# Patient Record
Sex: Male | Born: 1975 | Race: White | Hispanic: No | Marital: Married | Smoking: Current some day smoker
Health system: Southern US, Community
[De-identification: ages and names within clinical notes are randomized; demographics above are authoritative.]

## PROBLEM LIST (undated history)

## (undated) DIAGNOSIS — I2510 Atherosclerotic heart disease of native coronary artery without angina pectoris: Secondary | ICD-10-CM

## (undated) DIAGNOSIS — R112 Nausea with vomiting, unspecified: Secondary | ICD-10-CM

## (undated) DIAGNOSIS — I10 Essential (primary) hypertension: Secondary | ICD-10-CM

## (undated) DIAGNOSIS — M1990 Unspecified osteoarthritis, unspecified site: Secondary | ICD-10-CM

## (undated) DIAGNOSIS — Z8489 Family history of other specified conditions: Secondary | ICD-10-CM

## (undated) DIAGNOSIS — F329 Major depressive disorder, single episode, unspecified: Secondary | ICD-10-CM

## (undated) DIAGNOSIS — I209 Angina pectoris, unspecified: Secondary | ICD-10-CM

## (undated) DIAGNOSIS — K429 Umbilical hernia without obstruction or gangrene: Secondary | ICD-10-CM

## (undated) DIAGNOSIS — Z9889 Other specified postprocedural states: Secondary | ICD-10-CM

## (undated) DIAGNOSIS — K219 Gastro-esophageal reflux disease without esophagitis: Secondary | ICD-10-CM

## (undated) DIAGNOSIS — G629 Polyneuropathy, unspecified: Secondary | ICD-10-CM

## (undated) DIAGNOSIS — F32A Depression, unspecified: Secondary | ICD-10-CM

## (undated) HISTORY — PX: CORONARY ANGIOPLASTY: SHX604

## (undated) HISTORY — PX: TONSILLECTOMY: SUR1361

## (undated) HISTORY — PX: SHOULDER ARTHROSCOPY: SHX128

## (undated) HISTORY — PX: HERNIA REPAIR: SHX51

---

## 2006-02-27 ENCOUNTER — Emergency Department: Payer: Self-pay

## 2008-09-10 ENCOUNTER — Emergency Department: Payer: Self-pay | Admitting: Emergency Medicine

## 2009-03-27 ENCOUNTER — Emergency Department: Payer: Self-pay | Admitting: Emergency Medicine

## 2011-11-18 ENCOUNTER — Emergency Department: Payer: Self-pay | Admitting: Emergency Medicine

## 2012-11-24 HISTORY — PX: CORONARY STENT PLACEMENT: SHX1402

## 2012-11-25 ENCOUNTER — Inpatient Hospital Stay: Payer: Self-pay | Admitting: Internal Medicine

## 2012-11-25 LAB — CK TOTAL AND CKMB (NOT AT ARMC)
CK-MB: 0.6 ng/mL (ref 0.5–3.6)
CK-MB: 0.8 ng/mL (ref 0.5–3.6)

## 2012-11-25 LAB — COMPREHENSIVE METABOLIC PANEL
Albumin: 4.1 g/dL (ref 3.4–5.0)
Anion Gap: 6 — ABNORMAL LOW (ref 7–16)
BUN: 13 mg/dL (ref 7–18)
Chloride: 110 mmol/L — ABNORMAL HIGH (ref 98–107)
EGFR (African American): >60
EGFR (Non-African Amer.): >60
Osmolality: 278 (ref 275–301)
Potassium: 3.9 mmol/L (ref 3.5–5.1)
SGOT(AST): 22 U/L (ref 15–37)

## 2012-11-25 LAB — CBC
HCT: 43.6 % (ref 40.0–52.0)
HGB: 15.3 g/dL (ref 13.0–18.0)
MCH: 31.1 pg (ref 26.0–34.0)
RBC: 4.91 10*6/uL (ref 4.40–5.90)
RDW: 13.4 % (ref 11.5–14.5)
WBC: 11.1 10*3/uL — ABNORMAL HIGH (ref 3.8–10.6)

## 2012-11-25 LAB — TROPONIN I: Troponin-I: <0.02 ng/mL

## 2012-11-26 LAB — BASIC METABOLIC PANEL
Chloride: 114 mmol/L — ABNORMAL HIGH (ref 98–107)
EGFR (African American): >60
EGFR (Non-African Amer.): >60
Glucose: 112 mg/dL — ABNORMAL HIGH (ref 65–99)
Osmolality: 284 (ref 275–301)
Potassium: 3.6 mmol/L (ref 3.5–5.1)
Sodium: 142 mmol/L (ref 136–145)

## 2012-11-26 LAB — CBC WITH DIFFERENTIAL/PLATELET
Eosinophil #: 0.5 10*3/uL (ref 0.0–0.7)
Eosinophil %: 4.1 %
HCT: 39.5 % — ABNORMAL LOW (ref 40.0–52.0)
Lymphocyte %: 41.9 %
MCH: 31.4 pg (ref 26.0–34.0)
MCHC: 35.2 g/dL (ref 32.0–36.0)
MCV: 89 fL (ref 80–100)
Monocyte %: 7.9 %
Platelet: 235 10*3/uL (ref 150–440)
RBC: 4.44 10*6/uL (ref 4.40–5.90)
RDW: 13.5 % (ref 11.5–14.5)
WBC: 13.1 10*3/uL — ABNORMAL HIGH (ref 3.8–10.6)

## 2012-11-26 LAB — LIPID PANEL
Cholesterol: 131 mg/dL (ref 0–200)
HDL Cholesterol: 22 mg/dL — ABNORMAL LOW (ref 40–60)
Triglycerides: 193 mg/dL (ref 0–200)
VLDL Cholesterol, Calc: 39 mg/dL (ref 5–40)

## 2012-11-26 LAB — TROPONIN I: Troponin-I: <0.02 ng/mL

## 2012-11-26 LAB — CK TOTAL AND CKMB (NOT AT ARMC): CK-MB: 0.5 ng/mL (ref 0.5–3.6)

## 2012-11-27 LAB — BASIC METABOLIC PANEL
Anion Gap: 7 (ref 7–16)
BUN: 10 mg/dL (ref 7–18)
Calcium, Total: 8.6 mg/dL (ref 8.5–10.1)
Co2: 24 mmol/L (ref 21–32)
Creatinine: 0.88 mg/dL (ref 0.60–1.30)
EGFR (Non-African Amer.): >60
Osmolality: 282 (ref 275–301)
Potassium: 3.6 mmol/L (ref 3.5–5.1)
Sodium: 142 mmol/L (ref 136–145)

## 2012-11-27 LAB — CK TOTAL AND CKMB (NOT AT ARMC)
CK, Total: 149 U/L (ref 35–232)
CK-MB: 5 ng/mL — ABNORMAL HIGH (ref 0.5–3.6)

## 2012-12-14 ENCOUNTER — Encounter: Payer: Self-pay | Admitting: Cardiovascular Disease

## 2012-12-25 ENCOUNTER — Encounter: Payer: Self-pay | Admitting: Cardiovascular Disease

## 2012-12-26 ENCOUNTER — Emergency Department: Payer: Self-pay | Admitting: Emergency Medicine

## 2013-01-21 ENCOUNTER — Encounter: Payer: Self-pay | Admitting: Nurse Practitioner

## 2013-01-24 ENCOUNTER — Encounter: Payer: Self-pay | Admitting: Nurse Practitioner

## 2013-01-24 ENCOUNTER — Encounter: Payer: Self-pay | Admitting: Cardiovascular Disease

## 2013-02-07 ENCOUNTER — Ambulatory Visit: Payer: Self-pay | Admitting: Orthopedic Surgery

## 2013-02-24 ENCOUNTER — Encounter: Payer: Self-pay | Admitting: Nurse Practitioner

## 2013-07-22 ENCOUNTER — Other Ambulatory Visit: Payer: Self-pay | Admitting: Neurosurgery

## 2013-08-02 DIAGNOSIS — I209 Angina pectoris, unspecified: Secondary | ICD-10-CM

## 2013-08-02 HISTORY — DX: Angina pectoris, unspecified: I20.9

## 2013-08-02 LAB — BASIC METABOLIC PANEL
Anion Gap: 7 (ref 7–16)
BUN: 9 mg/dL (ref 7–18)
CALCIUM: 8.9 mg/dL (ref 8.5–10.1)
CHLORIDE: 109 mmol/L — AB (ref 98–107)
CO2: 24 mmol/L (ref 21–32)
Creatinine: 0.86 mg/dL (ref 0.60–1.30)
GLUCOSE: 96 mg/dL (ref 65–99)
OSMOLALITY: 278 (ref 275–301)
Potassium: 3.9 mmol/L (ref 3.5–5.1)
SODIUM: 140 mmol/L (ref 136–145)

## 2013-08-02 LAB — TROPONIN I: Troponin-I: <0.02 ng/mL

## 2013-08-02 LAB — CBC
HCT: 45.9 % (ref 40.0–52.0)
HGB: 15.3 g/dL (ref 13.0–18.0)
MCH: 29.9 pg (ref 26.0–34.0)
MCHC: 33.2 g/dL (ref 32.0–36.0)
MCV: 90 fL (ref 80–100)
PLATELETS: 282 10*3/uL (ref 150–440)
RBC: 5.1 10*6/uL (ref 4.40–5.90)
RDW: 13.3 % (ref 11.5–14.5)
WBC: 15.7 10*3/uL — AB (ref 3.8–10.6)

## 2013-08-03 ENCOUNTER — Inpatient Hospital Stay (HOSPITAL_COMMUNITY): Admission: RE | Admit: 2013-08-03 | Payer: Self-pay | Source: Ambulatory Visit

## 2013-08-03 ENCOUNTER — Observation Stay: Payer: Self-pay | Admitting: Specialist

## 2013-08-03 ENCOUNTER — Encounter (HOSPITAL_COMMUNITY): Payer: Self-pay | Admitting: Pharmacy Technician

## 2013-08-03 LAB — CK-MB
CK-MB: 0.5 ng/mL (ref 0.5–3.6)
CK-MB: 0.6 ng/mL (ref 0.5–3.6)
CK-MB: 0.6 ng/mL (ref 0.5–3.6)

## 2013-08-03 LAB — TROPONIN I: Troponin-I: <0.02 ng/mL

## 2013-08-03 LAB — LIPID PANEL
CHOLESTEROL: 113 mg/dL (ref 0–200)
HDL Cholesterol: 23 mg/dL — ABNORMAL LOW (ref 40–60)
Ldl Cholesterol, Calc: 58 mg/dL (ref 0–100)
TRIGLYCERIDES: 162 mg/dL (ref 0–200)
VLDL Cholesterol, Calc: 32 mg/dL (ref 5–40)

## 2013-08-08 ENCOUNTER — Encounter (HOSPITAL_COMMUNITY)
Admission: RE | Admit: 2013-08-08 | Discharge: 2013-08-08 | Disposition: A | Discharge status: Home / Self Care | Payer: No Typology Code available for payment source | Source: Ambulatory Visit | Attending: Neurosurgery | Admitting: Neurosurgery

## 2013-08-08 ENCOUNTER — Encounter (HOSPITAL_COMMUNITY): Payer: Self-pay

## 2013-08-08 HISTORY — DX: Gastro-esophageal reflux disease without esophagitis: K21.9

## 2013-08-08 HISTORY — DX: Umbilical hernia without obstruction or gangrene: K42.9

## 2013-08-08 HISTORY — DX: Major depressive disorder, single episode, unspecified: F32.9

## 2013-08-08 HISTORY — DX: Family history of other specified conditions: Z84.89

## 2013-08-08 HISTORY — DX: Polyneuropathy, unspecified: G62.9

## 2013-08-08 HISTORY — DX: Nausea with vomiting, unspecified: R11.2

## 2013-08-08 HISTORY — DX: Depression, unspecified: F32.A

## 2013-08-08 HISTORY — DX: Other specified postprocedural states: Z98.890

## 2013-08-08 HISTORY — DX: Atherosclerotic heart disease of native coronary artery without angina pectoris: I25.10

## 2013-08-08 HISTORY — DX: Angina pectoris, unspecified: I20.9

## 2013-08-08 HISTORY — DX: Unspecified osteoarthritis, unspecified site: M19.90

## 2013-08-08 HISTORY — DX: Essential (primary) hypertension: I10

## 2013-08-08 LAB — BASIC METABOLIC PANEL
BUN: 14 mg/dL (ref 6–23)
CALCIUM: 9.3 mg/dL (ref 8.4–10.5)
CO2: 24 meq/L (ref 19–32)
Chloride: 103 mEq/L (ref 96–112)
Creatinine, Ser: 0.83 mg/dL (ref 0.50–1.35)
GFR calc Af Amer: >90 mL/min (ref >90)
GFR calc non Af Amer: >90 mL/min (ref >90)
GLUCOSE: 109 mg/dL — AB (ref 70–99)
Potassium: 3.9 mEq/L (ref 3.7–5.3)
Sodium: 139 mEq/L (ref 137–147)

## 2013-08-08 LAB — CBC
HEMATOCRIT: 42.8 % (ref 39.0–52.0)
HEMOGLOBIN: 15 g/dL (ref 13.0–17.0)
MCH: 30.7 pg (ref 26.0–34.0)
MCHC: 35 g/dL (ref 30.0–36.0)
MCV: 87.7 fL (ref 78.0–100.0)
Platelets: 286 10*3/uL (ref 150–400)
RBC: 4.88 MIL/uL (ref 4.22–5.81)
RDW: 12.8 % (ref 11.5–15.5)
WBC: 13.1 10*3/uL — AB (ref 4.0–10.5)

## 2013-08-08 LAB — SURGICAL PCR SCREEN
MRSA, PCR: NEGATIVE
Staphylococcus aureus: NEGATIVE

## 2013-08-08 MED ORDER — CEFAZOLIN SODIUM-DEXTROSE 2-3 GM-% IV SOLR
2.0000 g | INTRAVENOUS | Status: AC
  Administered 2013-08-09: 2 g via INTRAVENOUS
  Filled 2013-08-08: qty 50

## 2013-08-08 MED ORDER — DEXAMETHASONE SODIUM PHOSPHATE 10 MG/ML IJ SOLN
10.0000 mg | INTRAMUSCULAR | Status: AC
  Administered 2013-08-09: 10 mg via INTRAVENOUS
  Filled 2013-08-08: qty 1

## 2013-08-08 NOTE — Progress Notes (Addendum)
Mr Gabriel Ward had a cardiac stent 11/2012, Cardiologist is Dr Laurelyn Sickle in [place].  Mr Gabriel Ward saw Dr Humphrey Rolls for clearance for surgery 06/23/13, patient was instructed at that time to stop Plavix 6 days prior to surgery.  Patient reports last dose was 08/03/13.     Mr Gabriel Ward was seen in [place] ED , 6/9 with chest pain.  EKG, troponins were normal.  Chest pain was thought to be due to GERD.  Patient had a stress test 6/10 at Dr Laurelyn Sickle office.  I requested all information from ED and Dr Laurelyn Sickle office.  O spoke with Dr Albertina Parr and read to him Dr Laurelyn Sickle note from ED notes. I do not have stress test or Dr Laurelyn Sickle notes from it as of yet.  No new orders from Dr Albertina Parr, it should be ok, we will  look at everything in am.    Mr Gabriel Ward denies any further chest pain.

## 2013-08-08 NOTE — Pre-Procedure Instructions (Signed)
Elmer  08/08/2013   Your procedure is scheduled on:  Tuesday, June 16.  Report to [facility] Admitting at 5:30 AM.  Call this number if you have problems the morning of surgery: [phone]   Remember:   Do not eat food or drink liquids after midnight.   Take these medicines the morning of surgery with A SIP OF WATER: metoprolol succinate (TOPROL-XL), sertraline (ZOLOFT).              Take if needed:cyclobenzaprine (FLEXERIL), HYDROcodone-acetaminophen (NORCO/VICODIN).    Do not wear jewelry, make-up or nail polish.  Do not wear lotions, powders, or perfumes.    Men may shave face and neck.  Do not bring valuables to the hospital.             [facility] is not responsible for any belongings or valuables.               Contacts, dentures or bridgework may not be worn into surgery.  Leave suitcase in the car. After surgery it may be brought to your room.  For patients admitted to the hospital, discharge time is determined by your  treatment team.               Patients discharged the day of surgery will not be allowed to drive home.  Name and phone number of your driver: -  Special Instructions: Review  [facility] - Preparing For Surgery.   Please read over the following fact sheets that you were given: Pain Booklet, Coughing and Deep Breathing and Surgical Site Infection Prevention

## 2013-08-09 ENCOUNTER — Ambulatory Visit (HOSPITAL_COMMUNITY)
Admission: RE | Admit: 2013-08-09 | Discharge: 2013-08-10 | Disposition: A | Discharge status: Home / Self Care | Payer: No Typology Code available for payment source | Source: Ambulatory Visit | Attending: Neurosurgery | Admitting: Neurosurgery

## 2013-08-09 ENCOUNTER — Encounter (HOSPITAL_COMMUNITY): Payer: No Typology Code available for payment source | Admitting: Anesthesiology

## 2013-08-09 ENCOUNTER — Ambulatory Visit (HOSPITAL_COMMUNITY): Payer: No Typology Code available for payment source | Admitting: Anesthesiology

## 2013-08-09 ENCOUNTER — Ambulatory Visit (HOSPITAL_COMMUNITY): Payer: No Typology Code available for payment source

## 2013-08-09 ENCOUNTER — Encounter (HOSPITAL_COMMUNITY)
Admission: RE | Disposition: A | Discharge status: Home / Self Care | Payer: Self-pay | Source: Ambulatory Visit | Attending: Neurosurgery

## 2013-08-09 ENCOUNTER — Encounter (HOSPITAL_COMMUNITY): Payer: Self-pay

## 2013-08-09 DIAGNOSIS — M5126 Other intervertebral disc displacement, lumbar region: Secondary | ICD-10-CM | POA: Insufficient documentation

## 2013-08-09 DIAGNOSIS — Z7982 Long term (current) use of aspirin: Secondary | ICD-10-CM | POA: Insufficient documentation

## 2013-08-09 DIAGNOSIS — M129 Arthropathy, unspecified: Secondary | ICD-10-CM | POA: Insufficient documentation

## 2013-08-09 DIAGNOSIS — K219 Gastro-esophageal reflux disease without esophagitis: Secondary | ICD-10-CM | POA: Insufficient documentation

## 2013-08-09 DIAGNOSIS — F329 Major depressive disorder, single episode, unspecified: Secondary | ICD-10-CM | POA: Insufficient documentation

## 2013-08-09 DIAGNOSIS — I2510 Atherosclerotic heart disease of native coronary artery without angina pectoris: Secondary | ICD-10-CM | POA: Insufficient documentation

## 2013-08-09 DIAGNOSIS — G589 Mononeuropathy, unspecified: Secondary | ICD-10-CM | POA: Insufficient documentation

## 2013-08-09 DIAGNOSIS — I209 Angina pectoris, unspecified: Secondary | ICD-10-CM | POA: Insufficient documentation

## 2013-08-09 DIAGNOSIS — K429 Umbilical hernia without obstruction or gangrene: Secondary | ICD-10-CM | POA: Insufficient documentation

## 2013-08-09 DIAGNOSIS — Z01812 Encounter for preprocedural laboratory examination: Secondary | ICD-10-CM | POA: Insufficient documentation

## 2013-08-09 DIAGNOSIS — Z7902 Long term (current) use of antithrombotics/antiplatelets: Secondary | ICD-10-CM | POA: Insufficient documentation

## 2013-08-09 DIAGNOSIS — F17200 Nicotine dependence, unspecified, uncomplicated: Secondary | ICD-10-CM | POA: Insufficient documentation

## 2013-08-09 DIAGNOSIS — I10 Essential (primary) hypertension: Secondary | ICD-10-CM | POA: Insufficient documentation

## 2013-08-09 DIAGNOSIS — F3289 Other specified depressive episodes: Secondary | ICD-10-CM | POA: Insufficient documentation

## 2013-08-09 HISTORY — PX: LUMBAR LAMINECTOMY/DECOMPRESSION MICRODISCECTOMY: SHX5026

## 2013-08-09 SURGERY — LUMBAR LAMINECTOMY/DECOMPRESSION MICRODISCECTOMY 1 LEVEL
Anesthesia: General | Site: Back | Laterality: Bilateral

## 2013-08-09 MED ORDER — HEMOSTATIC AGENTS (NO CHARGE) OPTIME
TOPICAL | Status: DC | PRN
  Administered 2013-08-09: 1 via TOPICAL

## 2013-08-09 MED ORDER — PHENOL 1.4 % MT LIQD: 1.0000 | OROMUCOSAL | Status: DC | PRN

## 2013-08-09 MED ORDER — ONDANSETRON HCL 4 MG/2ML IJ SOLN
4.0000 mg | INTRAMUSCULAR | Status: DC | PRN
  Administered 2013-08-09: 4 mg via INTRAVENOUS
  Filled 2013-08-09: qty 2

## 2013-08-09 MED ORDER — BUPIVACAINE HCL (PF) 0.5 % IJ SOLN
INTRAMUSCULAR | Status: DC | PRN
  Administered 2013-08-09: 20 mL

## 2013-08-09 MED ORDER — SODIUM CHLORIDE 0.9 % IR SOLN
Status: DC | PRN
  Administered 2013-08-09: 09:00:00

## 2013-08-09 MED ORDER — SUFENTANIL CITRATE 50 MCG/ML IV SOLN
INTRAVENOUS | Status: DC | PRN
  Administered 2013-08-09 (×2): 10 ug via INTRAVENOUS

## 2013-08-09 MED ORDER — SUFENTANIL CITRATE 50 MCG/ML IV SOLN
INTRAVENOUS | Status: AC
  Filled 2013-08-09: qty 1

## 2013-08-09 MED ORDER — PANTOPRAZOLE SODIUM 40 MG IV SOLR
40.0000 mg | Freq: Every day | INTRAVENOUS | Status: DC
  Filled 2013-08-09: qty 40

## 2013-08-09 MED ORDER — ACETAMINOPHEN 650 MG RE SUPP
650.0000 mg | RECTAL | Status: DC | PRN
Start: 2013-08-09 — End: 2013-08-10

## 2013-08-09 MED ORDER — METOPROLOL SUCCINATE ER 25 MG PO TB24
25.0000 mg | ORAL_TABLET | Freq: Every day | ORAL | Status: DC
  Filled 2013-08-09 (×2): qty 1

## 2013-08-09 MED ORDER — 0.9 % SODIUM CHLORIDE (POUR BTL) OPTIME
TOPICAL | Status: DC | PRN
  Administered 2013-08-09: 1000 mL

## 2013-08-09 MED ORDER — PANTOPRAZOLE SODIUM 40 MG PO TBEC
40.0000 mg | DELAYED_RELEASE_TABLET | Freq: Every day | ORAL | Status: DC
  Administered 2013-08-09: 40 mg via ORAL
  Filled 2013-08-09: qty 1

## 2013-08-09 MED ORDER — PROPOFOL 10 MG/ML IV BOLUS
INTRAVENOUS | Status: AC
  Filled 2013-08-09: qty 20

## 2013-08-09 MED ORDER — NEOSTIGMINE METHYLSULFATE 10 MG/10ML IV SOLN
INTRAVENOUS | Status: DC | PRN
  Administered 2013-08-09: 4 mg via INTRAVENOUS

## 2013-08-09 MED ORDER — CYCLOBENZAPRINE HCL 10 MG PO TABS
10.0000 mg | ORAL_TABLET | Freq: Three times a day (TID) | ORAL | Status: DC | PRN
  Administered 2013-08-09: 10 mg via ORAL
  Filled 2013-08-09: qty 1

## 2013-08-09 MED ORDER — SODIUM CHLORIDE 0.9 % IV SOLN: 250.0000 mL | INTRAVENOUS | Status: DC

## 2013-08-09 MED ORDER — ACETAMINOPHEN 325 MG PO TABS: 650.0000 mg | ORAL_TABLET | ORAL | Status: DC | PRN

## 2013-08-09 MED ORDER — HYDROCODONE-ACETAMINOPHEN 5-325 MG PO TABS
1.0000 | ORAL_TABLET | ORAL | Status: DC | PRN
  Administered 2013-08-09 – 2013-08-10 (×4): 2 via ORAL
  Filled 2013-08-09 (×4): qty 2

## 2013-08-09 MED ORDER — ROCURONIUM BROMIDE 100 MG/10ML IV SOLN
INTRAVENOUS | Status: DC | PRN
  Administered 2013-08-09: 50 mg via INTRAVENOUS
  Administered 2013-08-09: 10 mg via INTRAVENOUS

## 2013-08-09 MED ORDER — KCL IN DEXTROSE-NACL 20-5-0.45 MEQ/L-%-% IV SOLN
80.0000 mL/h | INTRAVENOUS | Status: DC
  Administered 2013-08-09: 80 mL/h via INTRAVENOUS
  Filled 2013-08-09 (×3): qty 1000

## 2013-08-09 MED ORDER — ATORVASTATIN CALCIUM 80 MG PO TABS
80.0000 mg | ORAL_TABLET | Freq: Every day | ORAL | Status: DC
  Administered 2013-08-09: 80 mg via ORAL
  Filled 2013-08-09 (×2): qty 1

## 2013-08-09 MED ORDER — SODIUM CHLORIDE 0.9 % IJ SOLN: 3.0000 mL | INTRAMUSCULAR | Status: DC | PRN

## 2013-08-09 MED ORDER — PROPOFOL 10 MG/ML IV BOLUS
INTRAVENOUS | Status: DC | PRN
  Administered 2013-08-09: 180 mg via INTRAVENOUS

## 2013-08-09 MED ORDER — ONDANSETRON HCL 4 MG/2ML IJ SOLN
INTRAMUSCULAR | Status: DC | PRN
  Administered 2013-08-09: 4 mg via INTRAVENOUS

## 2013-08-09 MED ORDER — ASPIRIN EC 81 MG PO TBEC
81.0000 mg | DELAYED_RELEASE_TABLET | Freq: Every day | ORAL | Status: DC
  Administered 2013-08-09: 81 mg via ORAL
  Filled 2013-08-09 (×2): qty 1

## 2013-08-09 MED ORDER — DOCUSATE SODIUM 100 MG PO CAPS
100.0000 mg | ORAL_CAPSULE | Freq: Two times a day (BID) | ORAL | Status: DC
  Administered 2013-08-09 (×2): 100 mg via ORAL
  Filled 2013-08-09 (×4): qty 1

## 2013-08-09 MED ORDER — SODIUM CHLORIDE 0.9 % IJ SOLN
3.0000 mL | Freq: Two times a day (BID) | INTRAMUSCULAR | Status: DC
  Administered 2013-08-09: 3 mL via INTRAVENOUS

## 2013-08-09 MED ORDER — KETOROLAC TROMETHAMINE 30 MG/ML IJ SOLN
INTRAMUSCULAR | Status: DC | PRN
  Administered 2013-08-09: 30 mg via INTRAVENOUS

## 2013-08-09 MED ORDER — THROMBIN 5000 UNITS EX SOLR
CUTANEOUS | Status: DC | PRN
  Administered 2013-08-09 (×2): 5000 [IU] via TOPICAL

## 2013-08-09 MED ORDER — GLYCOPYRROLATE 0.2 MG/ML IJ SOLN
INTRAMUSCULAR | Status: DC | PRN
  Administered 2013-08-09: .7 mg via INTRAVENOUS

## 2013-08-09 MED ORDER — CEFAZOLIN SODIUM-DEXTROSE 2-3 GM-% IV SOLR
2.0000 g | Freq: Three times a day (TID) | INTRAVENOUS | Status: AC
  Administered 2013-08-09 (×2): 2 g via INTRAVENOUS
  Filled 2013-08-09 (×2): qty 50

## 2013-08-09 MED ORDER — FENTANYL CITRATE 0.05 MG/ML IJ SOLN
25.0000 ug | INTRAMUSCULAR | Status: DC | PRN
  Administered 2013-08-09 (×2): 50 ug via INTRAVENOUS

## 2013-08-09 MED ORDER — FENTANYL CITRATE 0.05 MG/ML IJ SOLN
INTRAMUSCULAR | Status: AC
  Filled 2013-08-09: qty 2

## 2013-08-09 MED ORDER — HYDROMORPHONE HCL PF 1 MG/ML IJ SOLN: 1.0000 mg | INTRAMUSCULAR | Status: DC | PRN

## 2013-08-09 MED ORDER — MIDAZOLAM HCL 2 MG/2ML IJ SOLN
INTRAMUSCULAR | Status: AC
  Filled 2013-08-09: qty 2

## 2013-08-09 MED ORDER — KETOROLAC TROMETHAMINE 30 MG/ML IJ SOLN
30.0000 mg | Freq: Four times a day (QID) | INTRAMUSCULAR | Status: DC
  Administered 2013-08-09 – 2013-08-10 (×4): 30 mg via INTRAVENOUS
  Filled 2013-08-09 (×8): qty 1

## 2013-08-09 MED ORDER — ONDANSETRON HCL 4 MG/2ML IJ SOLN
INTRAMUSCULAR | Status: AC
  Filled 2013-08-09: qty 2

## 2013-08-09 MED ORDER — MENTHOL 3 MG MT LOZG
1.0000 | LOZENGE | OROMUCOSAL | Status: DC | PRN
Start: 2013-08-09 — End: 2013-08-10

## 2013-08-09 MED ORDER — MIDAZOLAM HCL 5 MG/5ML IJ SOLN
INTRAMUSCULAR | Status: DC | PRN
  Administered 2013-08-09: 2 mg via INTRAVENOUS

## 2013-08-09 MED ORDER — GABAPENTIN 300 MG PO CAPS
600.0000 mg | ORAL_CAPSULE | Freq: Every day | ORAL | Status: DC
  Administered 2013-08-09: 600 mg via ORAL
  Filled 2013-08-09 (×2): qty 2

## 2013-08-09 MED ORDER — LACTATED RINGERS IV SOLN
INTRAVENOUS | Status: DC | PRN
  Administered 2013-08-09 (×2): via INTRAVENOUS

## 2013-08-09 MED ORDER — PHENYLEPHRINE HCL 10 MG/ML IJ SOLN
INTRAMUSCULAR | Status: DC | PRN
Start: 2013-08-09 — End: 2013-08-09
  Administered 2013-08-09: 40 ug via INTRAVENOUS

## 2013-08-09 MED ORDER — LIDOCAINE HCL (CARDIAC) 20 MG/ML IV SOLN
INTRAVENOUS | Status: DC | PRN
  Administered 2013-08-09: 50 mg via INTRAVENOUS

## 2013-08-09 MED ORDER — SERTRALINE HCL 100 MG PO TABS
100.0000 mg | ORAL_TABLET | Freq: Every day | ORAL | Status: DC
  Filled 2013-08-09 (×2): qty 1

## 2013-08-09 SURGICAL SUPPLY — 47 items
BAG DECANTER FOR FLEXI CONT (MISCELLANEOUS) ×3 IMPLANT
BENZOIN TINCTURE PRP APPL 2/3 (GAUZE/BANDAGES/DRESSINGS) ×3 IMPLANT
BLADE 10 SAFETY STRL DISP (BLADE) ×3 IMPLANT
BLADE SURG ROTATE 9660 (MISCELLANEOUS) ×3 IMPLANT
BRUSH SCRUB EZ PLAIN DRY (MISCELLANEOUS) ×3 IMPLANT
BUR CUTTER 7.0 ROUND (BURR) ×3 IMPLANT
BUR MATCHSTICK NEURO 3.0 LAGG (BURR) ×3 IMPLANT
CANISTER SUCT 3000ML (MISCELLANEOUS) ×3 IMPLANT
CLOSURE WOUND 1/2 X4 (GAUZE/BANDAGES/DRESSINGS) ×2
CONT SPEC 4OZ CLIKSEAL STRL BL (MISCELLANEOUS) ×3 IMPLANT
DERMABOND ADVANCED (GAUZE/BANDAGES/DRESSINGS) ×2
DERMABOND ADVANCED .7 DNX12 (GAUZE/BANDAGES/DRESSINGS) ×1 IMPLANT
DRAPE LAPAROTOMY 100X72X124 (DRAPES) ×3 IMPLANT
DRAPE MICROSCOPE ZEISS OPMI (DRAPES) ×3 IMPLANT
DRAPE SURG 17X23 STRL (DRAPES) ×6 IMPLANT
DRSG OPSITE POSTOP 4X6 (GAUZE/BANDAGES/DRESSINGS) ×3 IMPLANT
DRSG TELFA 3X8 NADH (GAUZE/BANDAGES/DRESSINGS) ×3 IMPLANT
DURAPREP 26ML APPLICATOR (WOUND CARE) ×3 IMPLANT
ELECT BLADE 4.0 EZ CLEAN MEGAD (MISCELLANEOUS) ×3
ELECT REM PT RETURN 9FT ADLT (ELECTROSURGICAL) ×3
ELECTRODE BLDE 4.0 EZ CLN MEGD (MISCELLANEOUS) ×1 IMPLANT
ELECTRODE REM PT RTRN 9FT ADLT (ELECTROSURGICAL) ×1 IMPLANT
GAUZE SPONGE 4X4 16PLY XRAY LF (GAUZE/BANDAGES/DRESSINGS) IMPLANT
GLOVE ECLIPSE 8.0 STRL XLNG CF (GLOVE) ×3 IMPLANT
GOWN STRL REUS W/ TWL LRG LVL3 (GOWN DISPOSABLE) IMPLANT
GOWN STRL REUS W/ TWL XL LVL3 (GOWN DISPOSABLE) ×1 IMPLANT
GOWN STRL REUS W/TWL 2XL LVL3 (GOWN DISPOSABLE) IMPLANT
GOWN STRL REUS W/TWL LRG LVL3 (GOWN DISPOSABLE)
GOWN STRL REUS W/TWL XL LVL3 (GOWN DISPOSABLE) ×2
KIT BASIN OR (CUSTOM PROCEDURE TRAY) ×3 IMPLANT
KIT ROOM TURNOVER OR (KITS) ×3 IMPLANT
NEEDLE HYPO 22GX1.5 SAFETY (NEEDLE) ×3 IMPLANT
NEEDLE SPNL 22GX3.5 QUINCKE BK (NEEDLE) ×6 IMPLANT
NS IRRIG 1000ML POUR BTL (IV SOLUTION) ×3 IMPLANT
PACK LAMINECTOMY NEURO (CUSTOM PROCEDURE TRAY) ×3 IMPLANT
PAD ARMBOARD 7.5X6 YLW CONV (MISCELLANEOUS) ×9 IMPLANT
PATTIES SURGICAL .75X.75 (GAUZE/BANDAGES/DRESSINGS) ×3 IMPLANT
RUBBERBAND STERILE (MISCELLANEOUS) ×6 IMPLANT
SPONGE GAUZE 4X4 12PLY (GAUZE/BANDAGES/DRESSINGS) ×3 IMPLANT
SPONGE SURGIFOAM ABS GEL SZ50 (HEMOSTASIS) ×3 IMPLANT
STRIP CLOSURE SKIN 1/2X4 (GAUZE/BANDAGES/DRESSINGS) ×4 IMPLANT
SUT VIC AB 2-0 OS6 18 (SUTURE) ×9 IMPLANT
SUT VIC AB 3-0 CP2 18 (SUTURE) ×3 IMPLANT
SYR 20ML ECCENTRIC (SYRINGE) ×3 IMPLANT
TOWEL OR 17X24 6PK STRL BLUE (TOWEL DISPOSABLE) ×3 IMPLANT
TOWEL OR 17X26 10 PK STRL BLUE (TOWEL DISPOSABLE) ×3 IMPLANT
WATER STERILE IRR 1000ML POUR (IV SOLUTION) ×3 IMPLANT

## 2013-08-09 NOTE — Anesthesia Postprocedure Evaluation (Signed)
  Anesthesia Post-op Note  Patient: Gabriel Ward  Procedure(s) Performed: Procedure(s) with comments: BILATERAL LUMBAR LAMINECTOMY/DECOMPRESSION MICRODISCECTOMY LUMBAR FIVE-FOUR  (Bilateral) - BILATERAL LUMBAR LAMINECTOMY/DECOMPRESSION MICRODISCECTOMY LUMBAR FIVE-FOUR   Patient Location: PACU  Anesthesia Type:General  Level of Consciousness: awake  Airway and Oxygen Therapy: Patient Spontanous Breathing  Post-op Pain: mild  Post-op Assessment: Post-op Vital signs reviewed  Post-op Vital Signs: Reviewed  Last Vitals:  Filed Vitals:   08/09/13 1000  BP: 103/72  Pulse: 78  Temp:   Resp: 15    Complications: No apparent anesthesia complications

## 2013-08-09 NOTE — Op Note (Signed)
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 Localizing x-rays taken prior to incision to identify the appropriate level. Midline incision was made above the spinous processes of L4 and L5. Using Bovie cutting current the incision was carried down to the spinous processes. Subperiosteal dissection was then carried out bilaterally on the spinous processes lamina facet joint at L4-5. Self-retaining tract was placed for exposure and x-ray showed approach the appropriate level. Spinous process was mostly removed at L4-5. On the patient's left side generous laminotomy was performed removing the inferior one half of the L4 lamina the medial one third of the facet joint the superior one third of the L5 lamina. Residual bone and ligamentum flavum removed in a piecemeal fashion. Similar decompression was then carried on the opposite side. At this time the microscope was draped brought into the field and used for the remainder of the case. On the left side, microdissection technique was used to identify the lateral aspect of the thecal sac and L5 nerve root. Further coagulation was carried out down before the canal to identify the L4-5 disc. It was coagulated and incised a 15 blade. Thorough displaced cleanout was carried operatively towards the midline a large fragments of disc were noted to be present. Thorough displaced cleanout was carried out with the same time great care was taken to avoid injury to the neural elements this was successfully done. At this point inspection was carried out in all directions for any evidence of residual compression and none could be identified. Irrigation was carried out and any bleeding control proper  coagulation Gelfoam. The was then closed in multiple layers of Vicryl on the muscle fascia subcutaneous and subcuticular tissues. Dermabond and Steri-Strips were placed on the skin. A sterile dressing was applied and the patient was extubated and taken to recovery room in stable condition.

## 2013-08-09 NOTE — Anesthesia Preprocedure Evaluation (Signed)
Anesthesia Evaluation  Patient identified by MRN, date of birth, ID band Patient awake    Reviewed: Allergy & Precautions, H&P , NPO status , Patient's Chart, lab work & pertinent test results  History of Anesthesia Complications (+) PONV  Airway Mallampati: II      Dental   Pulmonary Current Smoker,  breath sounds clear to auscultation        Cardiovascular hypertension, + angina + CAD Rhythm:Regular Rate:Normal     Neuro/Psych    GI/Hepatic GERD-  ,  Endo/Other    Renal/GU      Musculoskeletal   Abdominal   Peds  Hematology   Anesthesia Other Findings   Reproductive/Obstetrics                           Anesthesia Physical Anesthesia Plan  ASA: III  Anesthesia Plan: General   Post-op Pain Management:    Induction: Intravenous  Airway Management Planned: Oral ETT  Additional Equipment:   Intra-op Plan:   Post-operative Plan: Extubation in OR  Informed Consent: I have reviewed the patients History and Physical, chart, labs and discussed the procedure including the risks, benefits and alternatives for the proposed anesthesia with the patient or authorized representative who has indicated his/her understanding and acceptance.   Dental advisory given  Plan Discussed with: CRNA and Anesthesiologist  Anesthesia Plan Comments:         Anesthesia Quick Evaluation

## 2013-08-09 NOTE — H&P (Signed)
Gabriel Ward is an 38 y.o. male.   Chief Complaint: Back pain into the left leg HPI: The patient is a 38-year-old gentleman who was evaluated in the office for back pain with radiation into the left leg and numbness of both feet. He's had this problem for 8 months with no inciting event. Is trouble standing upright. He live with the problem for 1 month and then saw his medical doctor who sent to a pain specialist. Start physical therapy medication without improvement. He was evaluated in the office his MRI scan was reviewed which showed a very large central disc abnormality with significant stenosis at L4-5. After discussing the options and due to his lack of improvement with conservative therapy the patient requested surgery and now comes for a bilateral decompression L4-5 with a left-sided discectomy. I've had a long discussion with him regarding the risks and benefits of surgical intervention. The risks discussed include but are not limited to bleeding infection weakness numbness paralysis spinal fluid leakage coma and death. We have discussed alternative methods of therapy although risks and benefits of nonintervention. He's had the opportunity to ask numerous questions and appears to understand. With this information in hand he has requested we proceed with surgery.  Past Medical History  Diagnosis Date  . PONV (postoperative nausea and vomiting)   . Family history of anesthesia complication     Son- N/V  . Coronary artery disease   . Anginal pain 08/02/13    seen at [facility]  . Hypertension   . Depression   . GERD (gastroesophageal reflux disease)   . Hernia, umbilical   . Arthritis   . Neuropathy     Past Surgical History  Procedure Laterality Date  . Tonsillectomy    . Shoulder arthroscopy Left     bone spurs  . Hernia repair Right   . Coronary angioplasty    . Coronary stent placement  11/2012    History reviewed. No pertinent family history. Social History:  reports that  he has been smoking.  He does not have any smokeless tobacco history on file. He reports that he does not use illicit drugs. His alcohol history is not on file.  Allergies:  Allergies  Allergen Reactions  . Codeine     Medications Prior to Admission  Medication Sig Dispense Refill  . aspirin EC 81 MG tablet Take 81 mg by mouth daily.      . atorvastatin (LIPITOR) 80 MG tablet Take 80 mg by mouth daily.      . clopidogrel (PLAVIX) 75 MG tablet Take 75 mg by mouth daily with breakfast.      . gabapentin (NEURONTIN) 300 MG capsule Take 600 mg by mouth at bedtime.      . HYDROcodone-acetaminophen (NORCO/VICODIN) 5-325 MG per tablet Take 1 tablet by mouth every 6 (six) hours as needed for moderate pain.      . metoprolol succinate (TOPROL-XL) 25 MG 24 hr tablet Take 25 mg by mouth daily.      . pantoprazole (PROTONIX) 40 MG tablet Take 40 mg by mouth 2 (two) times daily.      . sertraline (ZOLOFT) 100 MG tablet Take 100 mg by mouth daily.      . cyclobenzaprine (FLEXERIL) 10 MG tablet Take 10 mg by mouth 3 (three) times daily as needed for muscle spasms.        Results for orders placed during the hospital encounter of 08/08/13 (from the past 48 hour(s))  SURGICAL PCR   SCREEN     Status: None   Collection Time    08/08/13  8:52 AM      Result Value Ref Range   MRSA, PCR NEGATIVE  NEGATIVE   Staphylococcus aureus NEGATIVE  NEGATIVE   Comment:            The Xpert SA Assay (FDA     approved for NASAL specimens     in patients over 71 years of age),     is one component of     a comprehensive surveillance     program.  Test performance has     been validated by [employer] for patients greater     than or equal to 30 year old.     It is not intended     to diagnose infection nor to     guide or monitor treatment.  BASIC METABOLIC PANEL     Status: Abnormal   Collection Time    08/08/13  8:52 AM      Result Value Ref Range   Sodium 139  137 - 147 mEq/L   Potassium 3.9  3.7 -  5.3 mEq/L   Chloride 103  96 - 112 mEq/L   CO2 24  19 - 32 mEq/L   Glucose, Bld 109 (*) 70 - 99 mg/dL   BUN 14  6 - 23 mg/dL   Creatinine, Ser 0.83  0.50 - 1.35 mg/dL   Calcium 9.3  8.4 - 10.5 mg/dL   GFR calc non Af Amer >90  >90 mL/min   GFR calc Af Amer >90  >90 mL/min   Comment: (NOTE)     The eGFR has been calculated using the CKD EPI equation.     This calculation has not been validated in all clinical situations.     eGFR's persistently <90 mL/min signify possible Chronic Kidney     Disease.  CBC     Status: Abnormal   Collection Time    08/08/13  8:52 AM      Result Value Ref Range   WBC 13.1 (*) 4.0 - 10.5 K/uL   RBC 4.88  4.22 - 5.81 MIL/uL   Hemoglobin 15.0  13.0 - 17.0 g/dL   HCT 42.8  39.0 - 52.0 %   MCV 87.7  78.0 - 100.0 fL   MCH 30.7  26.0 - 34.0 pg   MCHC 35.0  30.0 - 36.0 g/dL   RDW 12.8  11.5 - 15.5 %   Platelets 286  150 - 400 K/uL   No results found.  Positive for high blood pressure high cholesterol and depression as well as a history of heart disease  Blood pressure 106/65, pulse 70, temperature 98 F (36.7 C), temperature source Oral, resp. rate 20, height 5' 10" (1.778 m), weight 109.317 kg (241 lb), SpO2 100.00%.  The patient is awake or and oriented. He is no facial asymmetry. His gait is slow and mildly antalgic. He is slight decreased strength of dorsiflexion on the left. Reflexes are 1+ and equal. Assessment/Plan Impression is that of a large central disc herniation at L4-5 with significant stenosis. The plan is for a bilateral decompression with unilateral discectomy  Faythe Ghee, MD 08/09/2013, 7:34 AM

## 2013-08-09 NOTE — Transfer of Care (Signed)
``  Immediate Anesthesia Transfer of Care Note  Patient: Gabriel Ward  Procedure(s) Performed: Procedure(s) with comments: BILATERAL LUMBAR LAMINECTOMY/DECOMPRESSION MICRODISCECTOMY LUMBAR FIVE-FOUR  (Bilateral) - BILATERAL LUMBAR LAMINECTOMY/DECOMPRESSION MICRODISCECTOMY LUMBAR FIVE-FOUR   Patient Location: PACU  Anesthesia Type:General  Level of Consciousness: awake  Airway & Oxygen Therapy: Patient Spontanous Breathing and Patient connected to nasal cannula oxygen  Post-op Assessment: Report given to PACU RN and Post -op Vital signs reviewed and stable  Post vital signs: Reviewed and stable  Complications: No apparent anesthesia complications

## 2013-08-10 MED ORDER — HYDROCODONE-ACETAMINOPHEN 5-325 MG PO TABS: 1.0000 | ORAL_TABLET | ORAL | Status: AC | PRN

## 2013-08-10 NOTE — Discharge Summary (Signed)
  Physician Discharge Summary  Patient ID: Gabriel Ward MRN: [id number] DOB/AGE: 1975-10-04 38 y.o.  Admit date: 08/09/2013 Discharge date: 08/10/2013  Admission Diagnoses:  Discharge Diagnoses:  Active Problems:   Lumbar herniated disc   Discharged Condition: good  Hospital Course: Surgery one day for severe stenosis and large HNP. Did well. Marked relief of leg pain. Ambulated well. Wound healing fine. Home pod 1, specific instructions given.  Consults: None  Significant Diagnostic Studies: none  Treatments: surgery: L 45 decompression and discectomy  Discharge Exam: Blood pressure 101/66, pulse 68, temperature 97.7 F (36.5 C), temperature source Oral, resp. rate 20, height 5' 10"  (1.778 m), weight 109.317 kg (241 lb), SpO2 97.00%. Incision/Wound:clean and dry; no new neuro issues  Disposition: Final discharge disposition not confirmed     Medication List    ASK your doctor about these medications       aspirin EC 81 MG tablet  Take 81 mg by mouth daily.     atorvastatin 80 MG tablet  Commonly known as:  LIPITOR  Take 80 mg by mouth daily.     clopidogrel 75 MG tablet  Commonly known as:  PLAVIX  Take 75 mg by mouth daily with breakfast.     cyclobenzaprine 10 MG tablet  Commonly known as:  FLEXERIL  Take 10 mg by mouth 3 (three) times daily as needed for muscle spasms.     gabapentin 300 MG capsule  Commonly known as:  NEURONTIN  Take 600 mg by mouth at bedtime.     HYDROcodone-acetaminophen 5-325 MG per tablet  Commonly known as:  NORCO/VICODIN  Take 1 tablet by mouth every 6 (six) hours as needed for moderate pain.     metoprolol succinate 25 MG 24 hr tablet  Commonly known as:  TOPROL-XL  Take 25 mg by mouth daily.     pantoprazole 40 MG tablet  Commonly known as:  PROTONIX  Take 40 mg by mouth 2 (two) times daily.     sertraline 100 MG tablet  Commonly known as:  ZOLOFT  Take 100 mg by mouth daily.         At home rest most of  the time. Get up 9 or 10 times each day and take a 15 or 20 minute walk. No riding in the car and to your first postoperative appointment. If you have neck surgery you may shower from the chest down starting on the third postoperative day. If you had back surgery he may start showering on the third postoperative day with saran wrap wrapped around your incisional area 3 times. After the shower remove the saran wrap. Take pain medicine as needed and other medications as instructed. Call my office for an appointment.  SignedFaythe Ghee, MD 08/10/2013, 9:11 AM

## 2013-08-10 NOTE — Progress Notes (Signed)
Pt. Alert and oriented,follows simple instructions, denies pain. Incision area without swelling, redness or S/S of infection. Voiding adequate clear yellow urine. Moving all extremities well and vitals stable and documented. Lumbar surgery notes instructions given to patient and family member for home safety and precautions. Pt. and family stated understanding of instructions given. Pain medication given prior to discharged.

## 2013-08-10 NOTE — Plan of Care (Signed)
Problem: Consults Goal: Diagnosis - Spinal Surgery Outcome: Completed/Met Date Met:  08/10/13 Lumbar Laminectomy (Complex)

## 2013-08-13 ENCOUNTER — Observation Stay (HOSPITAL_COMMUNITY)
Admission: EM | Admit: 2013-08-13 | Discharge: 2013-08-14 | Disposition: A | Discharge status: Home / Self Care | Payer: No Typology Code available for payment source | Attending: Neurosurgery | Admitting: Neurosurgery

## 2013-08-13 ENCOUNTER — Encounter (HOSPITAL_COMMUNITY): Payer: No Typology Code available for payment source | Admitting: Certified Registered Nurse Anesthetist

## 2013-08-13 ENCOUNTER — Encounter (HOSPITAL_COMMUNITY): Payer: Self-pay | Admitting: Emergency Medicine

## 2013-08-13 ENCOUNTER — Emergency Department (HOSPITAL_COMMUNITY): Payer: No Typology Code available for payment source | Admitting: Certified Registered Nurse Anesthetist

## 2013-08-13 ENCOUNTER — Encounter (HOSPITAL_COMMUNITY)
Admission: EM | Disposition: A | Discharge status: Home / Self Care | Payer: Self-pay | Source: Home / Self Care | Attending: Emergency Medicine

## 2013-08-13 ENCOUNTER — Emergency Department (HOSPITAL_COMMUNITY): Payer: No Typology Code available for payment source

## 2013-08-13 DIAGNOSIS — F3289 Other specified depressive episodes: Secondary | ICD-10-CM | POA: Insufficient documentation

## 2013-08-13 DIAGNOSIS — Z9861 Coronary angioplasty status: Secondary | ICD-10-CM | POA: Insufficient documentation

## 2013-08-13 DIAGNOSIS — Z6836 Body mass index (BMI) 36.0-36.9, adult: Secondary | ICD-10-CM | POA: Insufficient documentation

## 2013-08-13 DIAGNOSIS — G589 Mononeuropathy, unspecified: Secondary | ICD-10-CM | POA: Insufficient documentation

## 2013-08-13 DIAGNOSIS — K219 Gastro-esophageal reflux disease without esophagitis: Secondary | ICD-10-CM | POA: Insufficient documentation

## 2013-08-13 DIAGNOSIS — Z79899 Other long term (current) drug therapy: Secondary | ICD-10-CM | POA: Insufficient documentation

## 2013-08-13 DIAGNOSIS — M129 Arthropathy, unspecified: Secondary | ICD-10-CM | POA: Insufficient documentation

## 2013-08-13 DIAGNOSIS — F329 Major depressive disorder, single episode, unspecified: Secondary | ICD-10-CM | POA: Insufficient documentation

## 2013-08-13 DIAGNOSIS — M5126 Other intervertebral disc displacement, lumbar region: Secondary | ICD-10-CM

## 2013-08-13 DIAGNOSIS — F17200 Nicotine dependence, unspecified, uncomplicated: Secondary | ICD-10-CM | POA: Insufficient documentation

## 2013-08-13 DIAGNOSIS — Y838 Other surgical procedures as the cause of abnormal reaction of the patient, or of later complication, without mention of misadventure at the time of the procedure: Secondary | ICD-10-CM | POA: Insufficient documentation

## 2013-08-13 DIAGNOSIS — T8149XA Infection following a procedure, other surgical site, initial encounter: Secondary | ICD-10-CM

## 2013-08-13 DIAGNOSIS — T8140XA Infection following a procedure, unspecified, initial encounter: Principal | ICD-10-CM | POA: Insufficient documentation

## 2013-08-13 DIAGNOSIS — Z7902 Long term (current) use of antithrombotics/antiplatelets: Secondary | ICD-10-CM | POA: Insufficient documentation

## 2013-08-13 DIAGNOSIS — T819XXA Unspecified complication of procedure, initial encounter: Secondary | ICD-10-CM

## 2013-08-13 DIAGNOSIS — I10 Essential (primary) hypertension: Secondary | ICD-10-CM | POA: Insufficient documentation

## 2013-08-13 DIAGNOSIS — E669 Obesity, unspecified: Secondary | ICD-10-CM | POA: Insufficient documentation

## 2013-08-13 DIAGNOSIS — I2510 Atherosclerotic heart disease of native coronary artery without angina pectoris: Secondary | ICD-10-CM | POA: Insufficient documentation

## 2013-08-13 DIAGNOSIS — Z7982 Long term (current) use of aspirin: Secondary | ICD-10-CM | POA: Insufficient documentation

## 2013-08-13 HISTORY — PX: LUMBAR WOUND DEBRIDEMENT: SHX1988

## 2013-08-13 LAB — BASIC METABOLIC PANEL
BUN: 17 mg/dL (ref 6–23)
CO2: 22 meq/L (ref 19–32)
CREATININE: 0.74 mg/dL (ref 0.50–1.35)
Calcium: 8.9 mg/dL (ref 8.4–10.5)
Chloride: 101 mEq/L (ref 96–112)
GFR calc Af Amer: >90 mL/min (ref >90)
GFR calc non Af Amer: >90 mL/min (ref >90)
Glucose, Bld: 97 mg/dL (ref 70–99)
Potassium: 3.7 mEq/L (ref 3.7–5.3)
Sodium: 137 mEq/L (ref 137–147)

## 2013-08-13 LAB — CBC
HEMATOCRIT: 39.6 % (ref 39.0–52.0)
HEMOGLOBIN: 13.8 g/dL (ref 13.0–17.0)
MCH: 30.2 pg (ref 26.0–34.0)
MCHC: 34.8 g/dL (ref 30.0–36.0)
MCV: 86.7 fL (ref 78.0–100.0)
PLATELETS: 311 10*3/uL (ref 150–400)
RBC: 4.57 MIL/uL (ref 4.22–5.81)
RDW: 12.7 % (ref 11.5–15.5)
WBC: 13 10*3/uL — AB (ref 4.0–10.5)

## 2013-08-13 LAB — GRAM STAIN

## 2013-08-13 LAB — SEDIMENTATION RATE: SED RATE: 45 mm/h — AB (ref 0–16)

## 2013-08-13 SURGERY — LUMBAR WOUND DEBRIDEMENT
Anesthesia: General | Site: Back

## 2013-08-13 MED ORDER — ARTIFICIAL TEARS OP OINT
TOPICAL_OINTMENT | OPHTHALMIC | Status: AC
  Filled 2013-08-13: qty 3.5

## 2013-08-13 MED ORDER — HYDROCORTISONE 1 % EX OINT
1.0000 "application " | TOPICAL_OINTMENT | Freq: Two times a day (BID) | CUTANEOUS | Status: DC | PRN
  Filled 2013-08-13: qty 28.35

## 2013-08-13 MED ORDER — SODIUM CHLORIDE 0.9 % IJ SOLN
3.0000 mL | Freq: Two times a day (BID) | INTRAMUSCULAR | Status: DC
  Administered 2013-08-13 – 2013-08-14 (×2): 3 mL via INTRAVENOUS

## 2013-08-13 MED ORDER — DOCUSATE SODIUM 100 MG PO CAPS
100.0000 mg | ORAL_CAPSULE | Freq: Two times a day (BID) | ORAL | Status: DC
  Administered 2013-08-14: 100 mg via ORAL
  Filled 2013-08-13: qty 1

## 2013-08-13 MED ORDER — OXYCODONE HCL 5 MG/5ML PO SOLN: 5.0000 mg | Freq: Once | ORAL | Status: AC | PRN

## 2013-08-13 MED ORDER — ONDANSETRON HCL 4 MG/2ML IJ SOLN
INTRAMUSCULAR | Status: AC
  Filled 2013-08-13: qty 2

## 2013-08-13 MED ORDER — SODIUM CHLORIDE 0.9 % IV SOLN
INTRAVENOUS | Status: DC
  Administered 2013-08-13: 75 mL/h via INTRAVENOUS

## 2013-08-13 MED ORDER — SCOPOLAMINE 1 MG/3DAYS TD PT72
MEDICATED_PATCH | TRANSDERMAL | Status: AC
  Filled 2013-08-13: qty 1

## 2013-08-13 MED ORDER — TRAMADOL HCL 50 MG PO TABS: 50.0000 mg | ORAL_TABLET | Freq: Four times a day (QID) | ORAL | Status: DC | PRN

## 2013-08-13 MED ORDER — CLOPIDOGREL BISULFATE 75 MG PO TABS
75.0000 mg | ORAL_TABLET | Freq: Every day | ORAL | Status: DC
  Filled 2013-08-13: qty 1

## 2013-08-13 MED ORDER — IOHEXOL 300 MG/ML  SOLN
100.0000 mL | Freq: Once | INTRAMUSCULAR | Status: AC | PRN
  Administered 2013-08-13: 100 mL via INTRAVENOUS

## 2013-08-13 MED ORDER — MIDAZOLAM HCL 5 MG/5ML IJ SOLN
INTRAMUSCULAR | Status: DC | PRN
  Administered 2013-08-13: 2 mg via INTRAVENOUS

## 2013-08-13 MED ORDER — FENTANYL CITRATE 0.05 MG/ML IJ SOLN
INTRAMUSCULAR | Status: AC
  Filled 2013-08-13: qty 5

## 2013-08-13 MED ORDER — PROPOFOL 10 MG/ML IV BOLUS
INTRAVENOUS | Status: AC
  Filled 2013-08-13: qty 20

## 2013-08-13 MED ORDER — THROMBIN 20000 UNITS EX SOLR
CUTANEOUS | Status: DC | PRN
  Administered 2013-08-13: 20:00:00 via TOPICAL

## 2013-08-13 MED ORDER — PHENOL 1.4 % MT LIQD: 1.0000 | OROMUCOSAL | Status: DC | PRN

## 2013-08-13 MED ORDER — ACETAMINOPHEN 500 MG PO TABS: 1000.0000 mg | ORAL_TABLET | Freq: Four times a day (QID) | ORAL | Status: DC | PRN

## 2013-08-13 MED ORDER — LIDOCAINE HCL (CARDIAC) 20 MG/ML IV SOLN
INTRAVENOUS | Status: DC | PRN
  Administered 2013-08-13: 50 mg via INTRAVENOUS

## 2013-08-13 MED ORDER — SENNA 8.6 MG PO TABS
1.0000 | ORAL_TABLET | Freq: Two times a day (BID) | ORAL | Status: DC
  Administered 2013-08-14: 8.6 mg via ORAL
  Filled 2013-08-13 (×2): qty 1

## 2013-08-13 MED ORDER — PANTOPRAZOLE SODIUM 40 MG PO TBEC
40.0000 mg | DELAYED_RELEASE_TABLET | Freq: Two times a day (BID) | ORAL | Status: DC
  Administered 2013-08-14: 40 mg via ORAL
  Filled 2013-08-13: qty 1

## 2013-08-13 MED ORDER — CEFAZOLIN SODIUM-DEXTROSE 2-3 GM-% IV SOLR
2.0000 g | Freq: Three times a day (TID) | INTRAVENOUS | Status: AC
  Administered 2013-08-14 (×2): 2 g via INTRAVENOUS
  Filled 2013-08-13 (×2): qty 50

## 2013-08-13 MED ORDER — SCOPOLAMINE 1 MG/3DAYS TD PT72
MEDICATED_PATCH | TRANSDERMAL | Status: DC | PRN
  Administered 2013-08-13: 1 via TRANSDERMAL

## 2013-08-13 MED ORDER — PROPOFOL 10 MG/ML IV BOLUS
INTRAVENOUS | Status: DC | PRN
  Administered 2013-08-13: 170 mg via INTRAVENOUS

## 2013-08-13 MED ORDER — HYDROMORPHONE HCL PF 1 MG/ML IJ SOLN
INTRAMUSCULAR | Status: AC
  Filled 2013-08-13: qty 1

## 2013-08-13 MED ORDER — HYDROCODONE-ACETAMINOPHEN 5-325 MG PO TABS
1.0000 | ORAL_TABLET | ORAL | Status: DC | PRN
  Administered 2013-08-14 (×3): 2 via ORAL
  Filled 2013-08-13 (×3): qty 2

## 2013-08-13 MED ORDER — SUCCINYLCHOLINE CHLORIDE 20 MG/ML IJ SOLN
INTRAMUSCULAR | Status: DC | PRN
  Administered 2013-08-13: 120 mg via INTRAVENOUS

## 2013-08-13 MED ORDER — LACTATED RINGERS IV SOLN
INTRAVENOUS | Status: DC | PRN
  Administered 2013-08-13 (×2): via INTRAVENOUS

## 2013-08-13 MED ORDER — DEXAMETHASONE SODIUM PHOSPHATE 4 MG/ML IJ SOLN
INTRAMUSCULAR | Status: AC
  Filled 2013-08-13: qty 1

## 2013-08-13 MED ORDER — HYDROMORPHONE HCL PF 1 MG/ML IJ SOLN
1.0000 mg | Freq: Once | INTRAMUSCULAR | Status: AC
Start: 2013-08-13 — End: 2013-08-13
  Administered 2013-08-13: 1 mg via INTRAVENOUS
  Filled 2013-08-13: qty 1

## 2013-08-13 MED ORDER — ARTIFICIAL TEARS OP OINT
TOPICAL_OINTMENT | OPHTHALMIC | Status: DC | PRN
  Administered 2013-08-13: 1 via OPHTHALMIC

## 2013-08-13 MED ORDER — HEPARIN SODIUM (PORCINE) 5000 UNIT/ML IJ SOLN
5000.0000 [IU] | Freq: Three times a day (TID) | INTRAMUSCULAR | Status: DC
  Administered 2013-08-14 (×2): 5000 [IU] via SUBCUTANEOUS
  Filled 2013-08-13 (×3): qty 1

## 2013-08-13 MED ORDER — 0.9 % SODIUM CHLORIDE (POUR BTL) OPTIME
TOPICAL | Status: DC | PRN
  Administered 2013-08-13: 1000 mL

## 2013-08-13 MED ORDER — CYCLOBENZAPRINE HCL 10 MG PO TABS
10.0000 mg | ORAL_TABLET | Freq: Three times a day (TID) | ORAL | Status: DC | PRN
  Administered 2013-08-14: 10 mg via ORAL
  Filled 2013-08-13: qty 1

## 2013-08-13 MED ORDER — SODIUM CHLORIDE 0.9 % IJ SOLN: 3.0000 mL | INTRAMUSCULAR | Status: DC | PRN

## 2013-08-13 MED ORDER — MIDAZOLAM HCL 2 MG/2ML IJ SOLN
INTRAMUSCULAR | Status: AC
  Filled 2013-08-13: qty 2

## 2013-08-13 MED ORDER — PHENYLEPHRINE HCL 10 MG/ML IJ SOLN
INTRAMUSCULAR | Status: DC | PRN
  Administered 2013-08-13: 80 ug via INTRAVENOUS
  Administered 2013-08-13 (×3): 40 ug via INTRAVENOUS

## 2013-08-13 MED ORDER — OXYCODONE HCL 5 MG PO TABS: 5.0000 mg | ORAL_TABLET | Freq: Once | ORAL | Status: AC | PRN

## 2013-08-13 MED ORDER — ACETAMINOPHEN 325 MG PO TABS: 650.0000 mg | ORAL_TABLET | ORAL | Status: DC | PRN

## 2013-08-13 MED ORDER — SODIUM CHLORIDE 0.9 % IV SOLN: 250.0000 mL | INTRAVENOUS | Status: DC

## 2013-08-13 MED ORDER — SODIUM CHLORIDE 0.9 % IR SOLN
Status: DC | PRN
  Administered 2013-08-13 (×2)

## 2013-08-13 MED ORDER — METOPROLOL SUCCINATE ER 25 MG PO TB24
25.0000 mg | ORAL_TABLET | Freq: Every day | ORAL | Status: DC
  Administered 2013-08-14: 25 mg via ORAL
  Filled 2013-08-13: qty 1

## 2013-08-13 MED ORDER — ONDANSETRON HCL 4 MG/2ML IJ SOLN
INTRAMUSCULAR | Status: DC | PRN
  Administered 2013-08-13 (×2): 4 mg via INTRAVENOUS

## 2013-08-13 MED ORDER — PHENYLEPHRINE 40 MCG/ML (10ML) SYRINGE FOR IV PUSH (FOR BLOOD PRESSURE SUPPORT)
PREFILLED_SYRINGE | INTRAVENOUS | Status: AC
  Filled 2013-08-13: qty 10

## 2013-08-13 MED ORDER — CEFAZOLIN SODIUM 10 G IJ SOLR
3.0000 g | INTRAMUSCULAR | Status: DC | PRN
  Administered 2013-08-13: 2 g via INTRAVENOUS

## 2013-08-13 MED ORDER — SERTRALINE HCL 100 MG PO TABS
100.0000 mg | ORAL_TABLET | Freq: Every day | ORAL | Status: DC
  Filled 2013-08-13: qty 1

## 2013-08-13 MED ORDER — MORPHINE SULFATE 2 MG/ML IJ SOLN
1.0000 mg | INTRAMUSCULAR | Status: DC | PRN
  Administered 2013-08-13: 2 mg via INTRAVENOUS
  Administered 2013-08-14: 1 mg via INTRAVENOUS
  Filled 2013-08-13 (×2): qty 1

## 2013-08-13 MED ORDER — GABAPENTIN 300 MG PO CAPS
300.0000 mg | ORAL_CAPSULE | Freq: Every day | ORAL | Status: DC
  Administered 2013-08-13: 300 mg via ORAL
  Filled 2013-08-13 (×2): qty 1

## 2013-08-13 MED ORDER — MENTHOL 3 MG MT LOZG: 1.0000 | LOZENGE | OROMUCOSAL | Status: DC | PRN

## 2013-08-13 MED ORDER — FENTANYL CITRATE 0.05 MG/ML IJ SOLN
INTRAMUSCULAR | Status: DC | PRN
  Administered 2013-08-13: 100 ug via INTRAVENOUS
  Administered 2013-08-13 (×3): 50 ug via INTRAVENOUS

## 2013-08-13 MED ORDER — ONDANSETRON HCL 4 MG/2ML IJ SOLN: 4.0000 mg | INTRAMUSCULAR | Status: DC | PRN

## 2013-08-13 MED ORDER — ASPIRIN 325 MG PO TABS: 325.0000 mg | ORAL_TABLET | Freq: Every day | ORAL | Status: DC | PRN

## 2013-08-13 MED ORDER — ATORVASTATIN CALCIUM 80 MG PO TABS
80.0000 mg | ORAL_TABLET | Freq: Every day | ORAL | Status: DC
  Filled 2013-08-13: qty 1

## 2013-08-13 MED ORDER — HYDROMORPHONE HCL PF 1 MG/ML IJ SOLN
0.2500 mg | INTRAMUSCULAR | Status: DC | PRN
  Administered 2013-08-13 (×2): 0.5 mg via INTRAVENOUS

## 2013-08-13 MED ORDER — ACETAMINOPHEN 650 MG RE SUPP: 650.0000 mg | RECTAL | Status: DC | PRN

## 2013-08-13 MED ORDER — ONDANSETRON HCL 4 MG/2ML IJ SOLN
4.0000 mg | Freq: Once | INTRAMUSCULAR | Status: AC | PRN
  Administered 2013-08-13: 4 mg via INTRAVENOUS

## 2013-08-13 SURGICAL SUPPLY — 50 items
BAG DECANTER FOR FLEXI CONT (MISCELLANEOUS) ×3 IMPLANT
BLADE 10 SAFETY STRL DISP (BLADE) ×3 IMPLANT
CANISTER SUCT 3000ML (MISCELLANEOUS) ×3 IMPLANT
CONT SPEC 4OZ CLIKSEAL STRL BL (MISCELLANEOUS) IMPLANT
DERMABOND ADVANCED (GAUZE/BANDAGES/DRESSINGS) ×2
DERMABOND ADVANCED .7 DNX12 (GAUZE/BANDAGES/DRESSINGS) ×1 IMPLANT
DRAIN CHANNEL 10M FLAT 3/4 FLT (DRAIN) ×3 IMPLANT
DRAPE LAPAROTOMY 100X72X124 (DRAPES) ×3 IMPLANT
DRAPE POUCH INSTRU U-SHP 10X18 (DRAPES) ×3 IMPLANT
DRSG OPSITE POSTOP 4X6 (GAUZE/BANDAGES/DRESSINGS) ×3 IMPLANT
ELECT REM PT RETURN 9FT ADLT (ELECTROSURGICAL) ×3
ELECTRODE REM PT RTRN 9FT ADLT (ELECTROSURGICAL) ×1 IMPLANT
EVACUATOR SILICONE 100CC (DRAIN) ×3 IMPLANT
GAUZE SPONGE 4X4 16PLY XRAY LF (GAUZE/BANDAGES/DRESSINGS) IMPLANT
GLOVE BIO SURGEON STRL SZ 6.5 (GLOVE) ×2 IMPLANT
GLOVE BIO SURGEON STRL SZ7 (GLOVE) ×6 IMPLANT
GLOVE BIO SURGEONS STRL SZ 6.5 (GLOVE) ×1
GLOVE BIOGEL PI IND STRL 7.5 (GLOVE) ×1 IMPLANT
GLOVE BIOGEL PI INDICATOR 7.5 (GLOVE) ×2
GLOVE ECLIPSE 7.0 STRL STRAW (GLOVE) ×3 IMPLANT
GLOVE EXAM NITRILE LRG STRL (GLOVE) IMPLANT
GLOVE EXAM NITRILE MD LF STRL (GLOVE) IMPLANT
GLOVE EXAM NITRILE XL STR (GLOVE) IMPLANT
GLOVE EXAM NITRILE XS STR PU (GLOVE) IMPLANT
GOWN STRL REUS W/ TWL LRG LVL3 (GOWN DISPOSABLE) ×2 IMPLANT
GOWN STRL REUS W/ TWL XL LVL3 (GOWN DISPOSABLE) IMPLANT
GOWN STRL REUS W/TWL 2XL LVL3 (GOWN DISPOSABLE) IMPLANT
GOWN STRL REUS W/TWL LRG LVL3 (GOWN DISPOSABLE) ×4
GOWN STRL REUS W/TWL XL LVL3 (GOWN DISPOSABLE)
KIT BASIN OR (CUSTOM PROCEDURE TRAY) ×3 IMPLANT
KIT ROOM TURNOVER OR (KITS) ×3 IMPLANT
NEEDLE HYPO 22GX1.5 SAFETY (NEEDLE) ×3 IMPLANT
NS IRRIG 1000ML POUR BTL (IV SOLUTION) ×3 IMPLANT
PACK LAMINECTOMY NEURO (CUSTOM PROCEDURE TRAY) ×3 IMPLANT
PAD ARMBOARD 7.5X6 YLW CONV (MISCELLANEOUS) ×9 IMPLANT
SPONGE GAUZE 4X4 12PLY (GAUZE/BANDAGES/DRESSINGS) IMPLANT
SPONGE SURGIFOAM ABS GEL SZ50 (HEMOSTASIS) IMPLANT
SUT VIC AB 0 CT1 18XCR BRD 8 (SUTURE) ×2 IMPLANT
SUT VIC AB 0 CT1 8-18 (SUTURE) ×4
SUT VIC AB 1 CT1 18XBRD ANBCTR (SUTURE) IMPLANT
SUT VIC AB 1 CT1 8-18 (SUTURE)
SUT VIC AB 2-0 CP2 18 (SUTURE) IMPLANT
SUT VICRYL 3-0 RB1 18 ABS (SUTURE) ×3 IMPLANT
SWAB CULTURE LIQ STUART DBL (MISCELLANEOUS) IMPLANT
SYR 20ML ECCENTRIC (SYRINGE) ×3 IMPLANT
SYR BULB IRRIGATION 50ML (SYRINGE) ×3 IMPLANT
TOWEL OR 17X24 6PK STRL BLUE (TOWEL DISPOSABLE) ×3 IMPLANT
TOWEL OR 17X26 10 PK STRL BLUE (TOWEL DISPOSABLE) ×3 IMPLANT
TUBE ANAEROBIC SPECIMEN COL (MISCELLANEOUS) IMPLANT
WATER STERILE IRR 1000ML POUR (IV SOLUTION) ×3 IMPLANT

## 2013-08-13 NOTE — Transfer of Care (Signed)
Immediate Anesthesia Transfer of Care Note  Patient: Gabriel Ward  Procedure(s) Performed: Procedure(s): LUMBAR WOUND DEBRIDEMENT (N/A)  Patient Location: PACU  Anesthesia Type:General  Level of Consciousness: awake, alert  and oriented  Airway & Oxygen Therapy: Patient Spontanous Breathing and Patient connected to face mask oxygen  Post-op Assessment: Report given to PACU RN, Post -op Vital signs reviewed and stable and Patient moving all extremities X 4  Post vital signs: Reviewed and stable  Complications: No apparent anesthesia complications

## 2013-08-13 NOTE — Progress Notes (Signed)
Recieved patient from PACU. Patient alert and oriented X4  Rates pain at a 2/10 describes it as  A burning sensation but denied pain medication at this time. Dressing to back clean and dry  J.P drain in place  Oriented to room and unit spouse at bed side.

## 2013-08-13 NOTE — H&P (Addendum)
CC:  Chief Complaint  Patient presents with  . Wound Infection    HPI: Gabriel Ward is a 38 y.o. male who recently underwent L4-5 laminectomy/central discectomy on 08/09/13. He was discharged home the next day. Since he has been home his wife notes increasing bloody yellowish drainage from his wound. Over the last two days he has had increasing radness around his lower back and the area has become swollen. It has also become somewhat tender. He has had difficulty controlling his pain with Vicodin. He does not report any new leg pain, weakness, or bowel/bladder dysfunction. He has not had any fevers at home.  PMH: Past Medical History  Diagnosis Date  . PONV (postoperative nausea and vomiting)   . Family history of anesthesia complication     Son- N/V  . Coronary artery disease   . Anginal pain 08/02/13    seen at [facility]  . Hypertension   . Depression   . GERD (gastroesophageal reflux disease)   . Hernia, umbilical   . Arthritis   . Neuropathy     PSH: Past Surgical History  Procedure Laterality Date  . Tonsillectomy    . Shoulder arthroscopy Left     bone spurs  . Hernia repair Right   . Coronary angioplasty    . Coronary stent placement  11/2012    SH: History  Substance Use Topics  . Smoking status: Current Some Day Smoker -- 0.50 packs/day for 24 years  . Smokeless tobacco: Not on file  . Alcohol Use: Yes     Comment: occas    MEDS: Prior to Admission medications   Medication Sig Start Date End Date Taking? Authorizing Provider  acetaminophen (TYLENOL) 500 MG tablet Take 1,000 mg by mouth every 6 (six) hours as needed for moderate pain.   Yes Historical Provider, MD  aspirin 325 MG tablet Take 325 mg by mouth daily as needed for moderate pain (takes every 2 days).   Yes Historical Provider, MD  atorvastatin (LIPITOR) 80 MG tablet Take 80 mg by mouth daily.   Yes Historical Provider, MD  clopidogrel (PLAVIX) 75 MG tablet Take 75 mg by mouth daily with  breakfast.   Yes Historical Provider, MD  cyclobenzaprine (FLEXERIL) 10 MG tablet Take 10 mg by mouth 3 (three) times daily as needed for muscle spasms.   Yes Historical Provider, MD  gabapentin (NEURONTIN) 300 MG capsule Take 300 mg by mouth at bedtime.    Yes Historical Provider, MD  HYDROcodone-acetaminophen (NORCO/VICODIN) 5-325 MG per tablet Take 1-2 tablets by mouth every 4 (four) hours as needed for moderate pain. 08/10/13  Yes Faythe Ghee, MD  hydrocortisone 1 % ointment Apply 1 application topically 2 (two) times daily as needed for itching.   Yes Historical Provider, MD  metoprolol succinate (TOPROL-XL) 25 MG 24 hr tablet Take 25 mg by mouth daily.   Yes Historical Provider, MD  pantoprazole (PROTONIX) 40 MG tablet Take 40 mg by mouth 2 (two) times daily.   Yes Historical Provider, MD  sertraline (ZOLOFT) 100 MG tablet Take 100 mg by mouth daily.   Yes Historical Provider, MD  traMADol (ULTRAM) 50 MG tablet Take 50 mg by mouth every 6 (six) hours as needed for moderate pain.   Yes Historical Provider, MD    ALLERGY: Allergies  Allergen Reactions  . Codeine Other (See Comments)    Unknown reaction, can tolerate hydrocodone     ROS: ROS  NEUROLOGIC EXAM: Awake, alert, oriented Memory and  concentration grossly intact Speech fluent, appropriate CN grossly intact Motor exam: Upper Extremities Deltoid Bicep Tricep Grip  Right 5/5 5/5 5/5 5/5  Left 5/5 5/5 5/5 5/5   Lower Extremity IP Quad PF DF EHL  Right 5/5 5/5 5/5 5/5 5/5  Left 5/5 5/5 5/5 5/5 5/5   Sensation grossly intact to LT Wound: small area of dehiscence at superior margin, steri-strips saturated with blood-tinged yellowish drainage. Area is erythematous and TTP.  IMGAING: CT Lspine reviewed demonstrating subcutaneous fluid collection tracking beneath the lumbodorsal fascia extending to the paraspinal space. No thecal compression is seen.  LABS: WBC 13  IMPRESSION: - 38 y.o. male with possible wound  infection  PLAN: - OR for wound exploration/washout/debridement - Will check CRP - Postop abx  Treatment options were discussed with the patient and his wife, including treating empirically with abx and observation vs open wound washout. After all their questions were answered, they elected to proceed with surgical debridement. Risks/benefits of surgery were discussed and they provided consent to proceed.

## 2013-08-13 NOTE — Anesthesia Procedure Notes (Signed)
Procedure Name: Intubation Date/Time: 08/13/2013 7:41 PM Performed by: Hollie Salk Z Pre-anesthesia Checklist: Patient identified, Timeout performed, Emergency Drugs available, Suction available and Patient being monitored Patient Re-evaluated:Patient Re-evaluated prior to inductionOxygen Delivery Method: Circle system utilized Preoxygenation: Pre-oxygenation with 100% oxygen Intubation Type: IV induction, Rapid sequence and Cricoid Pressure applied Laryngoscope Size: 4 and Mac Grade View: Grade II Tube type: Oral Tube size: 8.0 mm Airway Equipment and Method: Stylet Placement Confirmation: ETT inserted through vocal cords under direct vision,  breath sounds checked- equal and bilateral and positive ETCO2 Secured at: 22 cm Tube secured with: Tape Dental Injury: Teeth and Oropharynx as per pre-operative assessment

## 2013-08-13 NOTE — ED Notes (Signed)
Lower back surgery ,  Surgical site is swollen, red, warm to touch and serosanqueous drainage.  Pt. Also has an odor.

## 2013-08-13 NOTE — ED Notes (Signed)
Pt transported to Neurosurgical OR PACU by this RN. Report given to [facility].

## 2013-08-13 NOTE — Anesthesia Preprocedure Evaluation (Addendum)
Anesthesia Evaluation  Patient identified by MRN, date of birth, ID band Patient awake    Reviewed: Allergy & Precautions, H&P   History of Anesthesia Complications (+) PONV and Family history of anesthesia reaction  Airway Mallampati: II TM Distance: >3 FB Neck ROM: Full    Dental  (+) Missing, Poor Dentition, Dental Advisory Given   Pulmonary Current Smoker,  breath sounds clear to auscultation        Cardiovascular hypertension, Pt. on home beta blockers with exertion + CAD Rhythm:Regular Rate:Normal     Neuro/Psych Depression    GI/Hepatic GERD-  ,  Endo/Other    Renal/GU      Musculoskeletal   Abdominal (+) + obese,   Peds  Hematology   Anesthesia Other Findings   Reproductive/Obstetrics                        Anesthesia Physical Anesthesia Plan  ASA: III and emergent  Anesthesia Plan: General   Post-op Pain Management:    Induction: Intravenous  Airway Management Planned: Oral ETT  Additional Equipment:   Intra-op Plan:   Post-operative Plan: Extubation in OR  Informed Consent: I have reviewed the patients History and Physical, chart, labs and discussed the procedure including the risks, benefits and alternatives for the proposed anesthesia with the patient or authorized representative who has indicated his/her understanding and acceptance.   Dental advisory given  Plan Discussed with: CRNA and Anesthesiologist  Anesthesia Plan Comments: (S/P Lumbar Laminectomy 6/16 now with wound infection CAD S/P PTCA with stent at [facility] 11/2012, no angina, tolerated surgery this week without complication Hypertension Smoker GERD PONV  Plan GA with oral ETT  Roberts Gaudy)       Anesthesia Quick Evaluation

## 2013-08-13 NOTE — ED Notes (Signed)
Gabriel Ward, Medical Secretary paged Neurosurgery to inform them pt. Has arrived

## 2013-08-13 NOTE — ED Provider Notes (Signed)
CSN: [id number]     Arrival date & time 08/13/13  1432 History   None    Chief Complaint  Patient presents with  . Wound Infection     (Consider location/radiation/quality/duration/timing/severity/associated sxs/prior Treatment) Patient is a 38 y.o. male presenting with back pain.  Back Pain Location:  Lumbar spine Quality:  Aching and stabbing Radiates to:  Does not radiate Pain severity:  Severe Onset quality:  Gradual Duration:  3 days Timing:  Constant Progression:  Worsening Chronicity:  New Context: not falling   Relieved by:  None tried Worsened by:  Nothing tried Ineffective treatments:  None tried Associated symptoms: leg pain (bilateral posterior) and tingling   Associated symptoms: no abdominal pain, no bladder incontinence, no bowel incontinence, no chest pain, no dysuria, no fever, no headaches, no numbness and no paresthesias   Risk factors: no hx of cancer and no steroid use     Past Medical History  Diagnosis Date  . PONV (postoperative nausea and vomiting)   . Family history of anesthesia complication     Son- N/V  . Coronary artery disease   . Anginal pain 08/02/13    seen at [facility]  . Hypertension   . Depression   . GERD (gastroesophageal reflux disease)   . Hernia, umbilical   . Arthritis   . Neuropathy    Past Surgical History  Procedure Laterality Date  . Tonsillectomy    . Shoulder arthroscopy Left     bone spurs  . Hernia repair Right   . Coronary angioplasty    . Coronary stent placement  11/2012   No family history on file. History  Substance Use Topics  . Smoking status: Current Some Day Smoker -- 0.50 packs/day for 24 years  . Smokeless tobacco: Not on file  . Alcohol Use: Yes     Comment: occas    Review of Systems  Constitutional: Negative for fever and chills.  HENT: Negative for congestion and rhinorrhea.   Eyes: Negative for pain.  Respiratory: Negative for cough and shortness of breath.   Cardiovascular: Negative for  chest pain and palpitations.  Gastrointestinal: Negative for vomiting, abdominal pain, diarrhea, constipation and bowel incontinence.  Endocrine: Negative for polydipsia and polyuria.  Genitourinary: Negative for bladder incontinence, dysuria and flank pain.  Musculoskeletal: Positive for back pain. Negative for neck pain.  Skin: Positive for rash (around surgical site) and wound. Negative for color change.  Neurological: Positive for tingling. Negative for dizziness, numbness, headaches and paresthesias.      Allergies  Codeine  Home Medications   Prior to Admission medications   Medication Sig Start Date End Date Taking? Authorizing Magdiel Bartles  acetaminophen (TYLENOL) 500 MG tablet Take 1,000 mg by mouth every 6 (six) hours as needed for moderate pain.   Yes Historical Raun Routh, MD  aspirin 325 MG tablet Take 325 mg by mouth daily as needed for moderate pain (takes every 2 days).   Yes Historical Kiel Cockerell, MD  atorvastatin (LIPITOR) 80 MG tablet Take 80 mg by mouth daily.   Yes Historical Alin Chavira, MD  clopidogrel (PLAVIX) 75 MG tablet Take 75 mg by mouth daily with breakfast.   Yes Historical Shaniquia Brafford, MD  cyclobenzaprine (FLEXERIL) 10 MG tablet Take 10 mg by mouth 3 (three) times daily as needed for muscle spasms.   Yes Historical Yandell Mcjunkins, MD  gabapentin (NEURONTIN) 300 MG capsule Take 300 mg by mouth at bedtime.    Yes Historical Devario Bucklew, MD  HYDROcodone-acetaminophen (NORCO/VICODIN) 5-325 MG per tablet Take  1-2 tablets by mouth every 4 (four) hours as needed for moderate pain. 08/10/13  Yes Faythe Ghee, MD  hydrocortisone 1 % ointment Apply 1 application topically 2 (two) times daily as needed for itching.   Yes Historical Almira Phetteplace, MD  metoprolol succinate (TOPROL-XL) 25 MG 24 hr tablet Take 25 mg by mouth daily.   Yes Historical Arjan Strohm, MD  pantoprazole (PROTONIX) 40 MG tablet Take 40 mg by mouth 2 (two) times daily.   Yes Historical Zadiel Leyh, MD  sertraline (ZOLOFT) 100 MG  tablet Take 100 mg by mouth daily.   Yes Historical Keiana Tavella, MD  traMADol (ULTRAM) 50 MG tablet Take 50 mg by mouth every 6 (six) hours as needed for moderate pain.   Yes Historical Maximina Pirozzi, MD   BP 126/80  Pulse 73  Temp(Src) 98.3 F (36.8 C) (Oral)  Resp 17  Ht _0  (1.778 m)  Wt 234 lb (106.142 kg)  BMI 33.58 kg/m2  SpO2 97% Physical Exam  Nursing note and vitals reviewed. Constitutional: He is oriented to person, place, and time. He appears well-developed and well-nourished.  HENT:  Head: Normocephalic and atraumatic.  Eyes: Conjunctivae and EOM are normal. Pupils are equal, round, and reactive to light.  Neck: Normal range of motion.  Cardiovascular: Normal rate and regular rhythm.   Pulmonary/Chest: Effort normal and breath sounds normal.  Abdominal: Soft. He exhibits no distension. There is no tenderness.  Musculoskeletal: Normal range of motion. He exhibits no edema and no tenderness.  Neurological: He is alert and oriented to person, place, and time.  Lower extremity motor 5/5, intact pain perception in distal extremities, no saddle anesthesia  Skin: Skin is warm and dry.  Wound with steri strips, surrounding erythema, mild edema and severe ttp. Serosanguinous drainage evident on steri-strips.     ED Course  Procedures (including critical care time) Labs Review Labs Reviewed  CBC - Abnormal; Notable for the following:    WBC 13.0 (*)    All other components within normal limits  SEDIMENTATION RATE - Abnormal; Notable for the following:    Sed Rate 45 (*)    All other components within normal limits  BASIC METABOLIC PANEL  C-REACTIVE PROTEIN    Imaging Review Ct Lumbar Spine W Contrast  08/13/2013   CLINICAL DATA:  Back surgery 08/09/2013. Now with wound drainage. Pain and redness at incision site. Rule out infection  EXAM: CT LUMBAR SPINE WITH CONTRAST  TECHNIQUE: Multidetector CT imaging of the lumbar spine was performed with intravenous contrast  administration. Multiplanar CT image reconstructions were also generated.  CONTRAST:  163m OMNIPAQUE IOHEXOL 300 MG/ML  SOLN  COMPARISON:  Lumbar MRI 02/07/2013  FINDINGS: Normal alignment. No fracture is identified. No evidence of discitis or endplate destruction.  T12-L1: Disc degeneration with a small central osteophyte due to chronic disc protrusion. No significant spinal stenosis.  L1-2:  Mild facet degeneration  L2-3:  Mild facet degeneration  L3-4: Small central disc protrusion with associated osteophyte. Mild facet degeneration and mild spinal stenosis  L4-5: Interval bilateral laminectomy. There remains disc bulging and central disc protrusion with associated osteophyte. Bilateral facet hypertrophy. There is a fluid collection in the soft tissues posterior to the laminectomy defect measuring 3 x 4 cm. No evidence of enhancement of the collection. There is no compression of the thecal sac. This could be an infected or sterile fluid collection.  L5-S1: Small left paracentral disc protrusion with associated osteophyte. There is bilateral facet hypertrophy with moderate foraminal encroachment bilaterally.  IMPRESSION: Decompressive laminectomy bilaterally at L4-5. Postop fluid collection is present posterior to the laminectomy defect. Given history of wound drainage and redness, this may be infected fluid collection however could also be due to CSF leak or postoperative blood.  Residual degenerative changes at other levels are unchanged from the preop study.   Electronically Signed   By: Franchot Gallo M.D.   On: 08/13/2013 17:57     EKG Interpretation None      MDM   Final diagnoses:  None    Pertinent positives and negatives from above HPI, ROS and PE include: 38 yo M w/ recent Bilateral L4-5 decompressive laminectomy with left L4-5 microdiscectomy on Tuesday here with pain and swelling, serosanguinous drainage around site. Was doing better then pain and swelling got worse. On exam has ttp  around surgical site, steri strips still in place. No obvious erythema, minimal swelling, significant ttp approx 6 cm around the site. Has erythema and warmth but difficult to tell if it is from cellulitis vs laying on his back and sweating a bit. Concern for post op infection, cellulitis, or hematoma. No evidence of cauda equina at this time. NSG had talked to patient earlier and was wanting ct lumbar w/ contrast. Will order then consult for recs.    On re-evaluation patients VS were stable/patient doing well. Elevated ESR and WBC. Fluid collection around previous operation site. NSG consulted and will admit for exploration and evaluation.   Labs, studies and imaging reviewed by myself and considered in medical decision making if ordered. Imaging interpreted by radiology. Pt was discussed with my attending, Dr. Mingo Amber.  New Prescriptions   No medications on file         Merrily Pew, MD 08/14/13 [phone]

## 2013-08-13 NOTE — Anesthesia Postprocedure Evaluation (Signed)
  Anesthesia Post-op Note  Patient: Gabriel Ward  Procedure(s) Performed: Procedure(s): LUMBAR WOUND DEBRIDEMENT (N/A)  Patient Location: PACU  Anesthesia Type:General  Level of Consciousness: awake, alert  and oriented  Airway and Oxygen Therapy: Patient Spontanous Breathing and Patient connected to nasal cannula oxygen  Post-op Pain: mild  Post-op Assessment: Post-op Vital signs reviewed, Patient's Cardiovascular Status Stable, Respiratory Function Stable, Patent Airway and Pain level controlled  Post-op Vital Signs: stable  Last Vitals:  Filed Vitals:   08/13/13 2109  BP: 127/85  Pulse: 86  Temp:   Resp: 14    Complications: No apparent anesthesia complications

## 2013-08-13 NOTE — ED Notes (Signed)
Consent Form with patient's paper chart.

## 2013-08-13 NOTE — ED Notes (Signed)
Neuro surgery at bedside.

## 2013-08-13 NOTE — Op Note (Signed)
PREOP DIAGNOSIS: Wound infection   POSTOP DIAGNOSIS: Same  PROCEDURE: 1. Exploration, debridement, and washout of lumbar wound  SURGEON: Dr. Consuella Lose, MD  ASSISTANT: None  ANESTHESIA: General Endotracheal  EBL: 100cc  SPECIMENS: Superficial and deep wound cultures  DRAINS: Subfacial JP  COMPLICATIONS: None immediate  CONDITION: Hemodynamically stable to PACU  HISTORY: Gabriel Ward is a 38 y.o. male presenting with increasing drainage from his lumbar wound, redness, pain, and swelling 6 days after undergoing L4-5 laminectomy/discectomy. His white count was elevated, and his CT demonstrated a fluid collection. Treatment options were discussed including empiric antibiotics vs open wound exploration and washout. They elected to proceed with surgery after the risks and benefits were explained.  PROCEDURE IN DETAIL: After informed consent was obtained and witnessed, the patient was brought to the operating room. After induction of general anesthesia, the patient was positioned on the operative table in the prone position. All pressure points were meticulously padded. The lower back was then prepped and draped in sterile fashion.  After timeout was conducted, the previous incision was opened with scissors. Some thin, serosanguinous fluid was encountered. Superficial cultures were taken. Self-retaining retractors were then placed, and the deeper fascial and muscular stitches were cut. A large amount of serosanguineous fluid was encountered. Deep wound cultures were taken from this space. No obvious areas of purulence were identified. There were some small areas of devitalized muscle and fascia which were excised. The interlaminar space was identified, and the thecal sac was identified. The wound was then washed with 2 L of bacitracin irrigation. Good hemostasis was then achieved using bipolar electrocautery.  A JP drain was placed subfascially and tunneled subcutaneously.  Muscle and fascia was then closed using interrupted 0 Vicryl stitches, subcutaneous layer was closed using 0 Vicryl stitches, and subcuticular layer was closed using interrupted 3-0 Vicryl stitches. The skin was closed using Dermabond. Sterile dressing was then applied. The patient was then transferred to the stretcher and taken to the PACU in stable hemodynamic condition.  At the end of the case all sponge, needle, and instrument counts were correct.

## 2013-08-13 NOTE — ED Notes (Signed)
Attemped IV insertion x2. 2nd RN to attempt

## 2013-08-14 ENCOUNTER — Encounter (HOSPITAL_COMMUNITY): Payer: Self-pay | Admitting: Neurosurgery

## 2013-08-14 LAB — C-REACTIVE PROTEIN: CRP: 7.9 mg/dL — ABNORMAL HIGH (ref <0.60)

## 2013-08-14 MED ORDER — CEPHALEXIN 500 MG PO CAPS: 500.0000 mg | ORAL_CAPSULE | Freq: Four times a day (QID) | ORAL | Status: AC

## 2013-08-14 MED ORDER — ASPIRIN 325 MG PO TABS: 325.0000 mg | ORAL_TABLET | Freq: Every day | ORAL | Status: AC | PRN

## 2013-08-14 MED ORDER — CLOPIDOGREL BISULFATE 75 MG PO TABS: 75.0000 mg | ORAL_TABLET | Freq: Every day | ORAL | Status: AC

## 2013-08-14 NOTE — ED Provider Notes (Signed)
I saw and evaluated the patient, reviewed the resident's note and I agree with the findings and plan.   EKG Interpretation None      Patient here with back pain - several days post-op from L4-L5 surgery. Patient sent in by Dr. Kathyrn Sheriff. CT ordered - shows fluid collection - unable to determine if abscess. Dr. Kathyrn Sheriff taking patient to OR for washout.  Osvaldo Shipper, MD 08/14/13 [phone]

## 2013-08-14 NOTE — Progress Notes (Signed)
Utilization review completed.  

## 2013-08-14 NOTE — Plan of Care (Signed)
Problem: Discharge Progression Outcomes Goal: Tubes and drains discontinued if indicated Outcome: Completed/Met Date Met:  08/14/13 Discontinued JP drain and dressed site twice due to small amount of bloody drainage.

## 2013-08-14 NOTE — Discharge Summary (Signed)
  Physician Discharge Summary  Patient ID: Gabriel Ward MRN: [id number] DOB/AGE: 1975/05/04 38 y.o.  Admit date: 08/13/2013 Discharge date: 08/14/2013  Admission Diagnoses: Possible wound infection  Discharge Diagnoses: Possible wound infection  Active Problems:   Wound infection after surgery   Discharged Condition: Stable  Hospital Course:  Mrs. Roland Lipke is a 38 y.o. male electively admitted after undergoing washout of his lumbar wound. On POD#1 he reported significantly improved pain, no drainage. He was walking well, tolerating diet, voiding normally. His wound culture gram stains were negative  Treatments: Surgery - Wound debridement, washout  Discharge Exam: Blood pressure 121/92, pulse 94, temperature 97.5 F (36.4 C), temperature source Axillary, resp. rate 20, height 5' 10"  (1.778 m), weight 114.669 kg (252 lb 12.8 oz), SpO2 97.00%. Awake, alert, oriented Speech fluent, appropriate CN grossly intact 5/5 BUE/BLE Wound c/d/i  Follow-up: Follow-up in Dr. Sande Rives office [facility] [phone]) as scheduled  Disposition: 01-Home or Self Care     Medication List         acetaminophen 500 MG tablet  Commonly known as:  TYLENOL  Take 1,000 mg by mouth every 6 (six) hours as needed for moderate pain.     aspirin 325 MG tablet  Take 1 tablet (325 mg total) by mouth daily as needed for moderate pain (takes every 2 days).  Start taking on:  08/15/2013     atorvastatin 80 MG tablet  Commonly known as:  LIPITOR  Take 80 mg by mouth daily.     cephALEXin 500 MG capsule  Commonly known as:  KEFLEX  Take 1 capsule (500 mg total) by mouth 4 (four) times daily.     clopidogrel 75 MG tablet  Commonly known as:  PLAVIX  Take 1 tablet (75 mg total) by mouth daily with breakfast.  Start taking on:  08/15/2013     cyclobenzaprine 10 MG tablet  Commonly known as:  FLEXERIL  Take 10 mg by mouth 3 (three) times daily as needed for  muscle spasms.     gabapentin 300 MG capsule  Commonly known as:  NEURONTIN  Take 300 mg by mouth at bedtime.     HYDROcodone-acetaminophen 5-325 MG per tablet  Commonly known as:  NORCO/VICODIN  Take 1-2 tablets by mouth every 4 (four) hours as needed for moderate pain.     hydrocortisone 1 % ointment  Apply 1 application topically 2 (two) times daily as needed for itching.     metoprolol succinate 25 MG 24 hr tablet  Commonly known as:  TOPROL-XL  Take 25 mg by mouth daily.     pantoprazole 40 MG tablet  Commonly known as:  PROTONIX  Take 40 mg by mouth 2 (two) times daily.     sertraline 100 MG tablet  Commonly known as:  ZOLOFT  Take 100 mg by mouth daily.     traMADol 50 MG tablet  Commonly known as:  ULTRAM  Take 50 mg by mouth every 6 (six) hours as needed for moderate pain.         SignedConsuella Lose, C 08/14/2013, 11:19 AM

## 2013-08-16 LAB — WOUND CULTURE
CULTURE: NO GROWTH
Culture: NO GROWTH

## 2013-08-19 LAB — ANAEROBIC CULTURE

## 2014-03-09 ENCOUNTER — Emergency Department: Payer: Self-pay | Admitting: Student

## 2014-03-09 LAB — URINALYSIS, COMPLETE
BILIRUBIN, UR: NEGATIVE
Glucose,UR: NEGATIVE mg/dL (ref 0–75)
Ketone: NEGATIVE
Leukocyte Esterase: NEGATIVE
NITRITE: NEGATIVE
Ph: 6 (ref 4.5–8.0)
Protein: NEGATIVE
Specific Gravity: 1.006 (ref 1.003–1.030)
Squamous Epithelial: NONE SEEN

## 2014-03-09 LAB — CBC
HCT: 45.4 % (ref 40.0–52.0)
HGB: 15 g/dL (ref 13.0–18.0)
MCH: 29.6 pg (ref 26.0–34.0)
MCHC: 33 g/dL (ref 32.0–36.0)
MCV: 90 fL (ref 80–100)
Platelet: 282 10*3/uL (ref 150–440)
RBC: 5.06 10*6/uL (ref 4.40–5.90)
RDW: 13.5 % (ref 11.5–14.5)
WBC: 13.9 10*3/uL — ABNORMAL HIGH (ref 3.8–10.6)

## 2014-03-09 LAB — COMPREHENSIVE METABOLIC PANEL
ALBUMIN: 3.7 g/dL (ref 3.4–5.0)
ALK PHOS: 97 U/L
ALT: 40 U/L
ANION GAP: 5 — AB (ref 7–16)
BUN: 15 mg/dL (ref 7–18)
Bilirubin,Total: 0.5 mg/dL (ref 0.2–1.0)
Calcium, Total: 8.7 mg/dL (ref 8.5–10.1)
Chloride: 108 mmol/L — ABNORMAL HIGH (ref 98–107)
Co2: 26 mmol/L (ref 21–32)
Creatinine: 1.11 mg/dL (ref 0.60–1.30)
EGFR (Non-African Amer.): >60
Glucose: 105 mg/dL — ABNORMAL HIGH (ref 65–99)
Osmolality: 279 (ref 275–301)
Potassium: 3.8 mmol/L (ref 3.5–5.1)
SGOT(AST): 25 U/L (ref 15–37)
Sodium: 139 mmol/L (ref 136–145)
Total Protein: 7.8 g/dL (ref 6.4–8.2)

## 2014-05-24 ENCOUNTER — Emergency Department
Admit: 2014-05-24 | Disposition: A | Discharge status: Home / Self Care | Payer: Self-pay | Admitting: Emergency Medicine

## 2014-05-24 LAB — CBC
HCT: 43.9 % (ref 40.0–52.0)
HGB: 14.7 g/dL (ref 13.0–18.0)
MCH: 30.1 pg (ref 26.0–34.0)
MCHC: 33.6 g/dL (ref 32.0–36.0)
MCV: 90 fL (ref 80–100)
Platelet: 282 10*3/uL (ref 150–440)
RBC: 4.89 10*6/uL (ref 4.40–5.90)
RDW: 13.8 % (ref 11.5–14.5)
WBC: 10 10*3/uL (ref 3.8–10.6)

## 2014-05-24 LAB — BASIC METABOLIC PANEL
Anion Gap: 8 (ref 7–16)
BUN: 12 mg/dL
CO2: 22 mmol/L
Calcium, Total: 9 mg/dL
Chloride: 106 mmol/L
Creatinine: 0.84 mg/dL
EGFR (African American): >60
GLUCOSE: 119 mg/dL — AB
Potassium: 4.1 mmol/L
SODIUM: 136 mmol/L

## 2014-05-24 LAB — TROPONIN I: Troponin-I: <0.03 ng/mL

## 2014-06-16 NOTE — Discharge Summary (Signed)
PATIENT NAME:  Gabriel Ward, Gabriel Ward MR#:  [id number] DATE OF BIRTH:  05-Apr-1975  DATE OF ADMISSION:  11/25/2012 DATE OF DISCHARGE:  11/27/2012  PRIMARY CARE PHYSICIAN:  Marcello Fennel.  CARDIOLOGIST:  Dr. Neoma Laming  FINAL DIAGNOSES: 1.  Unstable angina with coronary artery disease, status post stent placement.  2.  Hypertension.  3.  Hyperlipidemia.  4.  Tobacco abuse.   MEDICATIONS ON DISCHARGE: Include: 1.  Zoloft 100 mg daily. 2.  Aspirin 325 mg daily.  3.  Nitroglycerin 0.4 mg sublingually every 5 minutes as needed for chest pain, maximum 3. 4.  Atorvastatin 80 mg at bedtime. 5.  Plavix 75 mg daily. 6.  Metoprolol ER 25 mg daily.   DIET:  Low sodium diet, regular consistency.   ACTIVITY:  As tolerated. No exertional activity. No heavy lifting until followup with Dr. Humphrey Rolls.   FOLLOWUP:  With Dr. Humphrey Rolls, cardiology, Tuesday at 10:00 a.m.  Followup in 1 to 2 weeks with your medical doctor.   HOSPITAL COURSE: The patient was admitted 11/25/2012 and discharged 11/27/2012. Came in with chest pressure.   HISTORY OF PRESENT ILLNESS: A 39 year old man with hyperlipidemia, tobacco abuse, positive family history of heart disease. Started having chest pressure. He was admitted to the hospital for unstable angina. Dr. Humphrey Rolls, cardiology, saw the patient and scheduled a cardiac catheterization.  LABORATORY AND RADIOLOGICAL DATA:  Include:  EKG showed normal sinus rhythm. No acute Gabriel-T wave changes. Cardiac enzymes x 3 were negative. White blood cell count 11.1, H and H 15.3 and 43.6, platelet count of 243. Glucose 101, BUN 13, creatinine 0.83, sodium 139, potassium 3.9, chloride 110, CO2 23, calcium 9.1. Liver function tests normal range.   Chest x-ray:  No evidence of pneumonia or CHF. Minimal atelectasis.  TSH 4.46, LDL 70, HDL 22, triglycerides 193, hemoglobin A1c 5.5.  Echocardiogram showed EF 60% to 65%, mild mitral valve regurgitation.  Creatinine upon discharge 0.88. Cardiac cath showed EF  60%, left main normal LAD proximal 50%, ramus intermedius 80%, OM2 50%, RCA normal. The patient had a stent placement of the 80% blockage.  HOSPITAL COURSE PER PROBLEM LIST:  1.  For the patient's unstable angina, the patient was found to have coronary artery disease as per cardiac catheter and a stent was placed. The patient was watch again overnight. He was continued on aspirin, switched over to high-dose Lipitor and initially he was started on heparin drip, was given Brilinta in the catheter lab and was switched over to Plavix upon discharge home. He will continue aspirin, Plavix, metoprolol, Lipitor and p.r.n., nitroglycerin as outpatient. 2.  For the patient's hypertension, blood pressure is stable upon discharge 120/74. 3.  For his hyperlipidemia, his HDL is low.  His LDL goal should be less than 70 and it is 70. His simvastatin will be stopped and Lipitor high dose given. 4.  For his tobacco abuse, smoking cessation counseling done 3 minutes during the hospitalization.  TIME SPENT ON DISCHARGE:  35 minutes.    ____________________________ Tana Conch. Leslye Peer, MD rjw:ce D: 11/27/2012 12:45:49 ET T: 11/27/2012 13:23:02 ET JOB#: [id number]  cc: Tana Conch. Leslye Peer, MD, <Dictator> Dionisio David, MD Marisue Brooklyn MD ELECTRONICALLY SIGNED 11/27/2012 13:49

## 2014-06-16 NOTE — Consult Note (Signed)
Brief Consult Note: Diagnosis: CP.   Patient was seen by consultant.   Consult note dictated.   Comments: Patient is 37hr. old male with h/o HLD who presented with pressure like CP to ED, has had intermittent CP since last weekend. but today was constant and more severe. Pain in L side and had tingling in arms and feet, initial TNI neg, EKG with no acute abnormalities. Has a strong family history of CAD at an early age in siblings, mother, cousin. Heparin gtt started, on BB, statin, nitro and will proceed with cardiac catherization. Risks and benefits discussed with patient and willing to proceed.  Electronic Signatures: Angelica Ran (MD)   (Signed 03-Oct-14 08:13)  Co-Signer: Brief Consult Note Merla Riches (PA-C)   (Signed 02-Oct-14 16:23)  Authored: Brief Consult Note  Last Updated: 03-Oct-14 08:13 by Angelica Ran (MD)

## 2014-06-16 NOTE — Consult Note (Signed)
PATIENT NAME:  [name], CUFF MR#:  [id number] DATE OF BIRTH:  1975/09/02  DATE OF CONSULTATION:  11/25/2012  REFERRING PHYSICIAN:  Dustin Flock, MD CONSULTING PHYSICIAN:  Merla Riches, PA-C  PRIMARY CARE PHYSICIAN: Estell Harpin, FNP [facility])  REASON FOR CONSULTATION: Chest pain.   HISTORY OF PRESENT ILLNESS: [name]. Gabriel Ward is a 39 year old white male with a history of hyperlipidemia, nicotine dependence and very strong history of coronary artery disease. States that he began last Sunday with some mild intermittent chest pain when he was lying in bed. He thought this might have felt like acid reflux because it was burning in nature and felt as if he had something stuck in his throat the next morning. The patient's pain persisted and he was seen by his primary care physician who thought the pain was likely musculoskeletal and prescribed naproxen. The patient's pain was mild, however, this morning his pain became more severe and localized to the left side of the chest radiating under his axilla. He had associated numbness in his hands and feet but did not have any nausea or shortness of breath at that time. Pain was pressure-like and was severe until he was given nitro. His pain is currently a 3 out of 10 on a pain scale and he is not in any acute distress. Prior to his admission he denied any shortness of breath, orthopnea and edema. He has intermittent rare palpitations, but also does consume a large amount of caffeine.   PAST MEDICAL HISTORY: 1.  Chronic sinusitis.  2.  Hyperlipidemia.  3.  Diverticulitis with hospitalization in 2010 (symptoms resolved with antibiotics and did not require surgical therapy).   PAST SURGICAL HISTORY: 1.  Status post left shoulder surgery.  2.  Status post right inguinal hernia repair.  3.  Status post tonsillectomy.   ALLERGIES: CODEINE, unknown reaction that occurred as a child.   HOME MEDICATIONS: 1.  Aspirin 325 mg  p.o. daily.  2.  Naproxen 500 mg 1 tablet p.o. b.i.d. (started this week).  3.  Simvastatin 40 mg 1 tablet p.o. at bedtime. 4.  Zoloft 100 mg p.o. daily.   SOCIAL HISTORY: The patient is married, he works and rotates his shifts, smokes 1 pack per day for the last 20 years. He denies any alcohol or illicit drug use. The patient drinks about 1 to 2 pots of coffee per day, as well as 2 Mountain Dew sodas.   FAMILY HISTORY: Strong family history of coronary artery disease. Had 1 brother with 3 stents at age 59, sister with 2 stents by the age of 63, mother with coronary artery bypass grafting x 4 by age 35. All of his maternal uncles and aunts have had heart disease. Hypertension in his mother and maternal grandmother, diabetes mellitus in the maternal grandmother.  REVIEW OF SYSTEMS:  CONSTITUTIONAL: The patient denies any fevers. Complains of some fatigue and weakness. No unintentional weight loss.  EYES: The patient denies any blurred or double vision.  EARS, NOSE AND THROAT: The patient denies any tinnitus or epistaxis, but does have some chronic sinusitis issues.  RESPIRATORY: The patient denies any shortness of breath, coughing, wheezing, orthopnea.  CARDIOVASCULAR: The patient complains of chest pressure, intermittent palpitations. Denies any orthopnea.  GASTROENTEROLOGY: The patient denies any nausea, vomiting, diarrhea or abdominal pain.   PHYSICAL EXAMINATION: GENERAL: This is a well-developed male who is not in any acute distress at this time.  VITAL SIGNS: Taken from the Emergency Department  with blood pressure 120/76, O2 sat is 98%.  HEENT: Head atraumatic, normocephalic. Eyes: Pupils are round and equal. Conjunctivae pale, pink. Ears and nose are normal to external inspection. Mouth: Good dentition. Moist mucous membranes.  NECK: Supple. Trachea is midline. Thyroid is smooth and mobile. There is no JVD. There are no carotid bruits.  LUNGS: No accessory muscle use. Lungs are clear to  auscultation with no adventitious breath sounds.  HEART: Regular rate and rhythm. No murmurs, rubs or gallops appreciated.  ABDOMEN: Nondistended. Bowel sounds present in all 4 quadrants. It is soft and nontender to palpation.  EXTREMITIES: No cyanosis, clubbing or edema. Good pedal pulses bilaterally.   ANCILLARY DATA: EKG on admission: Normal sinus rhythm, 69 beats per minute.   Chest x-ray: No evidence of pneumonia or CHF, minimal atelectasis versus scar at the left lung base.   LABORATORY RESULTS: Glucose 101, BUN 13, creatinine 0.83, sodium 139, potassium 3.9, chloride 110. Estimated GFR is greater than 60. Total protein 7.6, albumin 4.1, total bilirubin 0.6, alkaline phosphatase 77, AST 22, ALT 35. Total CK 145, CK-MB 0.6 and troponin I is less than 0.02. White blood cell count 11.2, hemoglobin 15.3, hematocrit 43.6 and platelet count 243,000.   ASSESSMENT AND PLAN: Chest pain: The patient has a history of hyperlipidemia and a strong family history of coronary artery disease at a young age. His troponin on initial draw has been negative and there are no acute EKG changes; however, we will continue to cycle his cardiac enzymes. The patient has already been started on heparin drip, aspirin, statin and beta blocker. We will also add a PPI. We will order echocardiogram to assess his systolic function, diastolic function, and we will rule out any wall motion abnormalities. Cardiac catheterization will be scheduled for tomorrow with Dr. Neoma Laming to rule out any occlusive coronary artery disease. The procedure was discussed with the patient in detail today including the risks of the procedure which include but are not limited to bleeding, infection, hematoma and death. The agrees to proceed with the procedure as scheduled.   We will continue to follow this patient with you. Thank you very much for this consultation and allowing [place] to participate in this patient's  care. ____________________________ Merla Riches, PA-C mam:sb D: 11/25/2012 16:33:08 ET T: 11/25/2012 17:04:53 ET JOB#: [id number]  cc: Merla Riches, PA-C, <Dictator> Estell Harpin, FNP [facility]) [place] ELECTRONICALLY SIGNED 11/26/2012 15:18

## 2014-06-16 NOTE — H&P (Signed)
PATIENT NAME:  Gabriel Ward, Gabriel Ward MR#:  [id number] DATE OF BIRTH:  [birth year]  DATE OF ADMISSION:  11/25/2012  PRIMARY CARE PROVIDER: Marcello Fennel   EMERGENCY DEPARTMENT REFERRING PHYSICIAN: Dr. Ferman Hamming.   CHIEF COMPLAINT: Chest pressure.   HISTORY OF PRESENT ILLNESS: The patient is a 39 year old white male with history of hyperlipidemia, nicotine addiction, very strong family history of coronary artery disease, who states that last Sunday he started having some pressure-like feeling in the right substernal area of his chest. It felt like something was stuck there. Then on Tuesday, he started having more pressure in the left side of his chest with going to his left shoulder. Continued to persist on and off; therefore, he comes to the ED. In the ED, he has some nonspecific T wave inversions, but is  continuing to have the symptoms. The patient does denies any shortness of breath. He reports that he has some chronic tingling in his fingers. He denies any pain to his jaw. Denies any dyspnea on exertion. Denies any fevers, chills. No cough. No abdominal pain. No nausea, vomiting or diarrhea. Denies any urinary frequency, urgency or hesitancy.   PAST MEDICAL HISTORY: 1.  History of chronic sinusitis.  2.  Hyperlipidemia.   PAST SURGICAL HISTORY:  1.  Status post left shoulder surgery.  2.  Status post hernia surgery. 3.  Status post tonsillectomy.   ALLERGIES: CODEINE.   CURRENT MEDICATIONS: At home, he is on aspirin 325 daily, naproxen 500 mg 1 tab p.o. b.i.d., simvastatin 40 one tab p.o. at bedtime, Zoloft 100 daily.   SOCIAL HISTORY: He smokes about 1 pack per day for 20 years. No alcohol or drug use.   FAMILY HISTORY: Strong. Unknown about father, but brother had 3 stents at 54. Sister had 2 stents by age of 48. Mother had a CABG (4-vessel) by age 61. All the uncles and other aunts on his mother's side have heart disease.   REVIEW OF SYSTEMS:    CONSTITUTIONAL: Denies any fevers.  Complains of some fatigue, weakness, chest pressure as above. No weight loss. No weight gain.  EYES: No blurred or double vision. No pain. No redness. No inflammation. No glaucoma. No cataracts.  EARS, NOSE, THROAT: No tinnitus. No ear pain. No hearing loss. No seasonal or year-round allergies. Does have chronic sinusitis. No epistaxis. No difficulty swallowing.  RESPIRATORY: Denies any cough, wheezing, hemoptysis. No dyspnea.  CARDIOVASCULAR: Complains of chest pressure. No orthopnea. No edema. No arrhythmia. No palpitations.  GASTROINTESTINAL: No nausea, vomiting, diarrhea. No abdominal pain. No hematemesis. No melena. No ulcer. No GERD. No IBS. No jaundice.  GENITOURINARY: Denies any dysuria, hematuria, renal calculus or frequency.  ENDOCRINE: Denies any polyuria, nocturia or thyroid problems.  HEMATOLOGIC AND LYMPHATIC: Denies any major bruisability or bleeding.  SKIN: No acne. No rash. No changes in mole, hair or skin.  MUSCULOSKELETAL: Denies any pain in the neck, back or shoulder. NEUROLOGIC: No numbness. No CVA. No TIA. No seizures.  PSYCHIATRIC: No anxiety. No insomnia. No ADD.   PHYSICAL EXAMINATION:  GENERAL: The patient is a slightly obese male in no acute distress currently.  HEENT: Head atraumatic, normocephalic. Pupils equally round, reactive to light and accommodation. There is no conjunctival pallor. No scleral icterus. Nasal exam shows no drainage or ulceration. Oropharynx is clear without any exudate.  NECK: Supple, without JVD.  CARDIOVASCULAR: Regular rate and rhythm. No murmurs, rubs, clicks or gallops. PMI is not displaced.  LUNGS: Clear to auscultation bilaterally without any rales,  rhonchi, wheezing.  ABDOMEN: Soft, nontender, nondistended. Positive bowel sounds x 4.  EXTREMITIES: No clubbing, cyanosis or edema.  SKIN: No rash.  LYMPHATICS: No lymph nodes palpable.  VASCULAR: Good DP, PT pulses.  NEUROLOGICAL: Cranial nerves II through XII grossly intact. No focal  deficits.  PSYCHIATRIC: Not anxious or depressed.   LABORATORY, DIAGNOSTIC AND RADIOLOGICAL DATA: Glucose 101, BUN 13, creatinine 0.83, sodium 139, potassium 3.9, chloride 110; CO2 is 23, calcium 9.1. LFTs are normal. CPK 145, CK-MB 0.6, troponin less than 0.02. WBC 11.1, hemoglobin 15.3, platelet count 243. EKG shows nonspecific inferior T wave changes. Chest x-ray shows minimal atelectasis versus scarring at the left lung base.   ASSESSMENT AND PLAN: The patient is a 39 year old with nicotine addiction, hyperlipidemia, strong family history of coronary artery disease, presents with chest pressure.  1.  Chest pressure with multiple risk factors, concerning for unstable angina: At this time, I have spoken to Dr. Humphrey Rolls of cardiology who is on call. He will be taking the patient to cardiac cath a little bit later today. At this time, I will place him on aspirin. Will start him on heparin. Will start him on metoprolol if his heart rate allows. Will continue his simvastatin as taking previously.  2.  Hyperlipidemia: Continue simvastatin as taking at home. Lipid panel in the morning.  3.  Elevated blood glucose, likely due to glucose intolerance: Check a hemoglobin A1c.  4.  Nicotine addiction. The patient was counseled regarding smoking cessation, 4 minutes spent. I will hold a nicotine patch for now in light of his coronary symptoms.   NOTE: 45 minutes spent.   ____________________________ Lafonda Mosses. Posey Pronto, MD shp:jm D: 11/25/2012 14:12:11 ET T: 11/25/2012 14:27:19 ET JOB#: [id number]  cc: Kamron Portee H. Posey Pronto, MD, <Dictator> Alric Seton MD ELECTRONICALLY SIGNED 11/25/2012 16:23

## 2014-06-17 NOTE — H&P (Signed)
PATIENT NAME:  Gabriel Ward, Gabriel Ward MR#:  [id number] DATE OF BIRTH:  1975/05/03  DATE OF ADMISSION:  08/02/2013  REFERRING PHYSICIAN: Thomasene Lot.   PRIMARY CARE PHYSICIAN: Tobin Chad.   CARDIOLOGIST: Humphrey Rolls.   CHIEF COMPLAINT: Chest pain.   HISTORY OF PRESENT ILLNESS: This is a 39 year old Caucasian gentleman with history of coronary artery disease status post PCI and stent placement, presenting with chest pain. Describes acute onset of retrosternal chest pain described as pressure and burning in quality, 10 out of 10 in intensity, radiating to his back. No worsening or relieving factors. Associated nausea; however, denies any shortness of breath, palpitations, diaphoresis. His pain improved after a few minutes; however, still is present. Currently describes his pain as 2 out of 10 intensity, burning in quality, nonradiating. Otherwise no complaints.   REVIEW OF SYSTEMS:   CONSTITUTIONAL: Denies fever, fatigue, weakness.  EYES: Denies blurred vision, double vision, eye pain.  EARS, NOSE, THROAT: Denies tinnitus, ear pain, hearing loss.  RESPIRATORY: Denies cough, wheeze, shortness of breath.  CARDIOVASCULAR: Positive for chest pain as described above. Denies palpitations, edema.  GASTROINTESTINAL: Positive for nausea as described above. Denies any vomiting, diarrhea, abdominal pain.  GENITOURINARY: Denies dysuria, hematuria.  ENDOCRINE: Denies nocturia or thyroid problems.  HEMATOLOGY AND LYMPHATIC: Denies easy bruising, bleeding.  SKIN: Denies rashes or lesions.  MUSCULOSKELETAL: Denies pain in neck, back, shoulder, knees, hips or arthritic symptoms.  NEUROLOGIC: Denies paralysis or paresthesia.  PSYCHIATRIC: Anxiety or depressive symptoms.   Otherwise, full review of systems performed by me is negative.   PAST MEDICAL HISTORY: Hypertension, hyperlipidemia, coronary artery disease status post PCI and stent placement.   SOCIAL HISTORY: Positive for tobacco abuse, occasional alcohol. Denies any  drug usage.   FAMILY HISTORY: Positive for early onset coronary artery disease as well as deaths from myocardial infarctions in 81s.   ALLERGIES: CODEINE.   HOME MEDICATIONS: Include aspirin 325 mg every other day, Norco 325/5 mg p.o. q.6 hours as needed for pain, nitroglycerin 0.4 mg sublingual every 5 minutes as needed for chest pain, gabapentin 300 mg 2 capsules p.o. at bedtime, Zoloft 100 mg p.o. daily, atorvastatin 80 mg p.o. at bedtime, Plavix 75 mg p.o. daily, metoprolol 25 mg extended release p.o. daily, pantoprazole 40 mg p.o. daily.   PHYSICAL EXAMINATION:  VITAL SIGNS: Temperature 98.9, heart rate 68, respirations 18, blood pressure 141/94, saturating 99% on room air. Weight 108.9 kg, BMI 34.4.  GENERAL: Well-nourished, well-developed, Caucasian gentleman, currently in no acute distress.  HEAD: Normocephalic, atraumatic.  EYES: Pupils equal, round, reactive to light. Extraocular muscles intact. No scleral icterus.  MOUTH: Moist mucosal membranes. Dentition intact. No abscess noted.  EARS, NOSE, THROAT: Clear without exudates. No external lesions.  NECK: Supple. No thyromegaly. No nodules. No JVD.  PULMONARY: Clear to auscultation bilaterally without wheezes, rubs or rhonchi. No use of accessory muscles. Good respiratory effort.  CHEST: Nontender to palpation.  CARDIOVASCULAR: S1, S2, regular rate and rhythm. No murmurs, rubs or gallops. No edema. Pedal pulses 2+ bilaterally.  GASTROINTESTINAL: Soft, nontender, nondistended. No masses. Positive bowel sounds. No hepatosplenomegaly.  MUSCULOSKELETAL: No swelling, clubbing, edema. Range of motion full in all extremities.  NEUROLOGIC: Cranial nerves II through XII intact. No gross focal neurological deficits. Sensation intact. Reflexes intact.  SKIN: No ulcerations, lesions, rashes, or cyanosis. Skin warm, dry. Turgor intact.  PSYCHIATRIC: Mood and affect within normal limits. The patient is awake, alert, oriented x 3. Insight and  judgment intact.   LABORATORY DATA: EKG performed revealing normal sinus  rhythm. No Gabriel or T wave abnormalities. Sodium 140, potassium 3.9, chloride 109, bicarb 24, BUN 9, creatinine 0.86, glucose 96. Troponin I less than 0.02. WBC 15.7, hemoglobin 15.3, platelets 282. Chest x-ray performed revealing no acute cardiopulmonary process. CT abdomen and pelvis as well as chest performed revealing no acute findings.   ASSESSMENT AND PLAN: A 39 year old Caucasian gentleman with history of coronary artery disease status post percutaneous coronary intervention and stent placement, presenting with acute onset of retrosternal chest pain.  1. Chest pain: Admit to telemetry under observational status. Initiate aspirin and statin therapy. Continue with his Plavix. Trend cardiac enzymes x 3. Will consult his cardiologist, Dr. Humphrey Rolls.  2. Coronary artery disease: Continue with aspirin, statin, Plavix therapy, as well as beta-blockade.  3. Hyperlipidemia: Continue statin therapy.  4. Leukocytosis without evidence of infection: No indication for antibiotics at this time.  5. Venous thromboembolism prophylaxis with heparin subcutaneous.   The patient is FULL CODE.   TIME SPENT: 45 minutes.    ____________________________ Aaron Mose. Hower, MD dkh:gb D: 08/02/2013 23:35:23 ET T: 08/03/2013 00:18:25 ET JOB#: [id number]  cc: Aaron Mose. Hower, MD, <Dictator> DAVID Woodfin Ganja MD ELECTRONICALLY SIGNED 08/04/2013 8:15

## 2014-06-17 NOTE — Discharge Summary (Signed)
PATIENT NAME:  Gabriel Ward, Gabriel Ward MR#:  [id number] DATE OF BIRTH:  [birth year]  DATE OF ADMISSION:  08/03/2013 DATE OF DISCHARGE:  08/03/2013  For a detailed note, please take a look at the history and physical done by Valentino Nose.   DIAGNOSES AT DISCHARGE: As follows:  1.  Chest pain, likely related to gastroesophageal reflux disease.  2.  History of coronary artery disease.  3.  Hypertension.  4.  Hyperlipidemia.  5.  Depression.   The patient is being discharged on a low-sodium, low-fat diet.   ACTIVITY: As tolerated.   Follow up with Dr. Neoma Laming in the next 1 to 2 days.   DISCHARGE MEDICATIONS: Zoloft 100 mg daily, sublingual nitroglycerin as needed, atorvastatin 80 mg at bedtime, Plavix 75 mg daily, Toprol 25 mg daily, aspirin 325 mg daily, Protonix 40 mg daily, gabapentin 300 mg 2 tabs at bedtime, Norco 5/325, 1 tab q.6 hours as needed.   [place] COURSE: Dr. Neoma Laming from cardiology.   PERTINENT STUDIES DONE DURING THE HOSPITAL COURSE: Cardiac markers x 3 checked, which were negative. CT scan of the chest, abdomen and pelvis done with contrast showing no acute pathology in the chest, abdomen and pelvis. A chest x-ray done on admission showing no acute cardiopulmonary disease.   BRIEF HOSPITAL COURSE: This is a 39 year old male who presented to the hospital with chest pain as mentioned above.  1. Chest pain. The most likely cause of the patient's chest pain is either GERD or musculoskeletal in nature. The patient currently is chest pain-free and hemodynamically stable. He does have risk factors given his previous history of coronary disease and stent placement and ongoing tobacco abuse. He was, therefore, observed overnight in telemetry, had 3 sets of cardiac markers checked, which were negative. He was seen by cardiology who did not think that the patient needed acute cardiac intervention. They recommended getting a stress test done at their office,  which is being arranged for him tomorrow. Since the patient is currently chest pain-free, he will be discharged back on his aspirin, Plavix, beta blocker and statin.  2. GERD.  The patient was maintained on his Protonix. He will resume that.  3. Hypertension. The patient was maintained on his metoprolol. He will resume that.  4. Hyperlipidemia. The patient was maintained on his atorvastatin. He will also resume that.  5. Depression. The patient was maintained on his Zoloft and he will resume that upon discharge.   The patient is a FULL CODE.   TIME SPENT: 35 minutes.   ____________________________ Belia Heman. Verdell Carmine, MD vjs:dmm D: 08/03/2013 15:47:00 ET T: 08/03/2013 19:11:28 ET JOB#: [id number]  cc: Dionisio David, MD Belia Heman. Verdell Carmine, MD, <Dictator>   Henreitta Leber MD ELECTRONICALLY SIGNED 08/23/2013 20:28

## 2014-06-17 NOTE — Consult Note (Signed)
PATIENT NAME:  Gabriel Ward, Gabriel Ward MR#:  [id number] DATE OF BIRTH:  Feb 12, 1976  DATE OF CONSULTATION:  08/03/2013  CONSULTING PHYSICIAN:  Dionisio David, MD  INDICATION FOR CONSULTATION: Chest pain.   HISTORY OF PRESENT ILLNESS: This is a 39 year old white male with a past medical history of PCI and stenting multiple times, presented with 10/10 chest pain associated with shortness of breath and nausea but no vomiting. He had actually burning type of sensation in the chest associated with some shortness of breath. Burning type of pain got worse; thus, he presented to the Emergency Room. His chest pain has resolved right now. He ruled out for myocardial infarction, EKG is within normal limits.    PAST MEDICAL HISTORY: History of hypertension, hyperlipidemia, coronary artery disease, status post PCI and stenting.   SOCIAL HISTORY: Continues to smoke occasionally, drinks.   FAMILY HISTORY: Positive for coronary artery disease.   ALLERGIES: CODEINE.   PHYSICAL EXAMINATION: GENERAL: He is alert, oriented x 3, in no acute distress.  VITAL SIGNS: Stable.  NECK: No JVD.  LUNGS: Clear.  HEART: Regular rate and rhythm. Normal S1, S2. No audible murmur.  ABDOMEN: Soft, nontender, positive bowel sounds.  EXTREMITIES: No pedal edema.  NEUROLOGIC: The patient appears to be intact.   LABORATORY AND DIAGNOSTIC DATA: EKG showed normal sinus rhythm, 69 beats per minute, within normal limits. Cardiac enzymes and the rest of the labs are unremarkable.   ASSESSMENT AND PLAN: Atypical chest pain with history of coronary artery disease, percutaneous coronary intervention and stenting. Right now denies any chest pain. Myocardial infarction has been ruled out. We will discharge the patient today with followup stress test tomorrow at 9:30 in the office.   Thank you very much for the referral.   ____________________________ Dionisio David, MD sak:jcm D: 08/03/2013 14:02:18 ET T: 08/03/2013 14:50:51  ET JOB#: [id number]  cc: Dionisio David, MD, <Dictator> Dionisio David MD ELECTRONICALLY SIGNED 09/07/2013 9:19

## 2014-06-25 NOTE — Consult Note (Signed)
Admit Diagnosis:   LEFT FLANK PAIN: Onset Date: 09-Mar-2014, Status: Active, Description: LEFT FLANK PAIN    Diverticulitis:    CAD:    Angina:    MI (2014):    chronic sinusitis:    Denies medical history:    Cardiac Stent:    left shoulder surgery:    hernia surgery:    tonsillectomy:   Home Medications: Medication Instructions Status  nitroglycerin 0.4 mg sublingual tablet 1 tab(s) sublingual every five minutes, As needed for chest pain (max 3),  Active  atorvastatin 80 mg oral tablet 1 tab(s) orally once a day (at bedtime) Active  clopidogrel 75 mg oral tablet 1 tab(s) orally once a day Active  metoprolol succinate 25 mg oral tablet, extended release 1 tab(s) orally once a day Active  Zoloft 100 mg oral tablet 1 tab(s) orally once a day Active  aspirin 325 mg oral tablet 1 tab(s) orally every other day Active  pantoprazole 40 mg oral delayed release tablet 1 tab(s) orally once a day Active  Percocet 5/325 325 mg-5 mg oral tablet 1 tab(s) orally every 6 hours, As Needed Active   Lab Results: Hepatic:  14-Jan-16 10:15   Bilirubin, Total 0.5  Alkaline Phosphatase 97 (46-116 NOTE: New Reference Range 09/13/13)  SGPT (ALT) 40 (14-63 NOTE: New Reference Range 09/13/13)  SGOT (AST) 25  Total Protein, Serum 7.8  Albumin, Serum 3.7  Routine Chem:  14-Jan-16 10:15   Glucose, Serum  105  BUN 15  Creatinine (comp) 1.11  Sodium, Serum 139  Potassium, Serum 3.8  Chloride, Serum  108  CO2, Serum 26  Calcium (Total), Serum 8.7  Osmolality (calc) 279  eGFR (African American) >60  eGFR (Non-African American) >60 (eGFR values <66m/min/1.73 m2 may be an indication of chronic kidney disease (CKD). Calculated eGFR, using the MRDR Study equation, is useful in  patients with stable renal function. The eGFR calculation will not be reliable in acutely ill patients when serum creatinine is changing rapidly. It is not useful in patients on dialysis. The eGFR  calculation may not be applicable to patients at the low and high extremes of body sizes, pregnant women, and vegetarians.)  Anion Gap  5  Routine UA:  14-Jan-16 10:15   Color (UA) Straw  Clarity (UA) Clear  Glucose (UA) Negative  Bilirubin (UA) Negative  Ketones (UA) Negative  Specific Gravity (UA) 1.006  Blood (UA) 3+  pH (UA) 6.0  Protein (UA) Negative  Nitrite (UA) Negative  Leukocyte Esterase (UA) Negative (Result(s) reported on 09 Mar 2014 at 10:45AM.)  RBC (UA) 49 /HPF  WBC (UA) 1 /HPF  Bacteria (UA) TRACE  Epithelial Cells (UA) NONE SEEN  Result(s) reported on 09 Mar 2014 at 10:45AM.  Routine Hem:  14-Jan-16 10:15   WBC (CBC)  13.9  RBC (CBC) 5.06  Hemoglobin (CBC) 15.0  Hematocrit (CBC) 45.4  Platelet Count (CBC) 282 (Result(s) reported on 09 Mar 2014 at 10:45AM.)  MCV 90  MCH 29.6  MCHC 33.0  RDW 13.5   Radiology Results:  Radiology Results: LabUnknown:    14-Jan-16 12:28, CT Abdomen Pelvis WO for Stone  PACS Image  CT:  CT Abdomen Pelvis WO for Stone  REASON FOR EXAM:    left flank pain  COMMENTS:       PROCEDURE: CT  - CT ABDOMEN /PELVIS WO (STONE)  - Mar 09 2014 12:28PM     CLINICAL DATA:  Two week history of left flank pain associated with  gross hematuria. Surgical  history includes left inguinal hernia  repair.    EXAM:  CT ABDOMEN AND PELVIS WITHOUT CONTRAST    TECHNIQUE:  Multidetector CT imaging of the abdomen and pelvis was performed  following the standard protocol without IV contrast.  COMPARISON:  08/02/2013, 09/10/2008.    FINDINGS:  Approximate 4 mm calculus in the proximal left ureter causing severe  left hydronephrosis, mild left renal edema, and mild left  perinephric edema. No opaque urinary tract calculi elsewhere on  either side. Within the limits of the unenhanced technique, no focal  parenchymal abnormality involving either kidney.    Normal unenhanced appearance of the liver, spleen, pancreas, adrenal  glands, and  gallbladder. No biliary ductal dilation. Focus of  accessory splenic tissue medial to the spleen justbelow the hilum.  Moderate aortoiliac atherosclerosis without aneurysm. No significant  lymphadenopathy.  Stomach relatively decompressed and unremarkable. Normal-appearing  small bowel. Descending and sigmoid colon diverticulosis without  evidence of acute diverticulitis. Remainder of the colon  unremarkable with moderate stool burden. Mobile cecum present in the  right upper quadrant. Normal appendix in the right mid abdomen.  Small left periumbilical hernia containing fat, unchanged. No  ascites.    Urinary bladder decompressed and unremarkable. Prostate gland and  seminal vesicles normal for age. Scarring in the right inguinal  region without recurrent hernia. Very small left inguinal hernia  containing fat, unchanged.    Bone window images demonstrate multilevel degenerative disc disease  involving the lumbar spine and mild lower thoracic spondylosis.  Scattered areas of hyperlucency throughout the visualized lung bases  with scarring in the right middle lobe and lingula. Heart size  normal with mild aortic annular calcification.     IMPRESSION:  1. Obstructing approximate 4 mm calculus in the proximal left  ureter.  2. No opaque urinary tract calculi elsewhere on either side.  3. Descending and sigmoid colon diverticulosis without evidence of  acute diverticulitis.  4. Moderate aortoiliac atherosclerosis which is quite advanced for  age.  5. Scattered areas of hyperlucency in the visualized lung bases  consistent with asthma and/or COPD.  6. Small left periumbilical hernia containing fat and small left  inguinal hernia containing fat, stable.      Electronically Signed    By: Evangeline Dakin M.D.    On: 03/09/2014 13:07         Verified By: Deniece Portela, M.D.,    Codeine: N/V/Diarrhea   General Aspect 39 M with  h/o MI s/p cardiac stent, obesity,  hyperlipidemia, comes to ED with complaints of severe left flank pain over the past 1 week. He describes it as sharp, localized to his left flank and left abdomen, 10/10, lasting for several hours at a time. Last episode was this AM at 1:30. He has seen his PCP, and is on flomax, narcotics and antiemetics.  He has passed stone-like material over the past 1 week. No fevers/chills. No gross hematuria. No LUTS.  Today, when I saw him, he is completely comfortable with normal vital signs. His CT shows a ~3m fleck in the left mid ureter with moderate left hydronephrosis and mild perinephric stranding.  His WBC is 13.9, u/a is negative for infection and the rest of his labs are normal.   Case History and Physical Exam:  Past Medical Health Coronary Artery Disease, Hypertension, Obesity, Diverticulitis, Chronic Sinusitiz   Past Surgical History Cardiac Catheterization  Inguinal Hernia Repair   Family History Coronary Artery Disease   HEENT PERLA   Neck/Nodes  Supple   Chest/Lungs Clear   Cardiovascular No Murmurs or Gallops  Normal Sinus Rhythm   Abdomen Benign  Non-tender, no CVAT.   Genitalia Not examined   Rectal Not examined   Musculoskeletal Full range of motion   Neurological Grossly WNL   Skin Warm  Dry    Impression 39 year old man with left 7m ureteral stone and mild leukocytosis (13.9).  His kidney appears partially obstructed. In the ED, his pain is completely controlled, his vitals are normal, he is afebrile and urinalysis shows no signs of infection.  We discussed that he has a 95% chance of passing a stone within 4 weeks.  While his WBC is mildly elevated, he does not appear septic or in need of acute intervention, though we discussed the risks/benefits of emergent ureteral stent placement.  He prefers to go home and try to pass the stone.   Plan 1) Discharge from ED with flomax, percocet and cipro 5034mBID x 7 days 2) Encourage Fluid hydration to keep urine clear  (>2-3L per day) 3) Strain urine 4) Follow-up with Urology in 4 weeks with renal ultrasound 5) Strict return precautions for fever, inability to void, uncontrollable pain, or other serious concerns  Thank you for involving me in the care of Gabriel Ward.   Electronic Signatures: KaPrentiss BellsMD)  (Signed 14-Jan-16 15:11)  Authored: Health Issues, Significant Events - History, Home Medications, Labs, Radiology Results, Allergies, General Aspect/Present Illness, History and Physical Exam, Impression/Plan   Last Updated: 14-Jan-16 15:11 by KaPrentiss BellsMD)

## 2016-10-04 IMAGING — CT CT ABD-PELV W/O CM
2 of 4 series · 15 of 46 positions shown, 17 images · non-contrast
Comparison: 08/02/2013, 09/10/2008.

CLINICAL DATA: Two week history of left flank pain associated with
gross hematuria. Surgical history includes left inguinal hernia
repair.

EXAM:
CT ABDOMEN AND PELVIS WITHOUT CONTRAST
TECHNIQUE: Multidetector CT imaging of the abdomen and pelvis was performed
following the standard protocol without IV contrast.

[Series 2: stone standard full · axial · 0.76mm/px · z∈[-518,-98]mm · 12 of 100 slices shown, 14 images]
[im 8/100  soft-tissue]
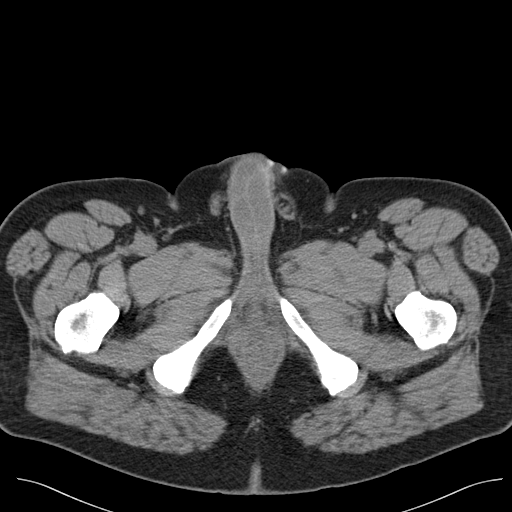
[im 8/100  bone]
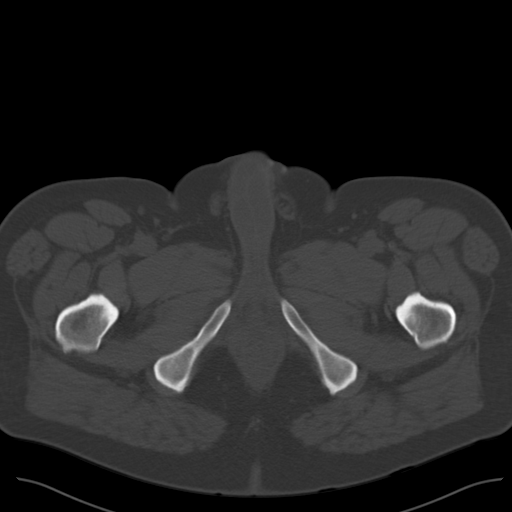
[im 16/100  soft-tissue]
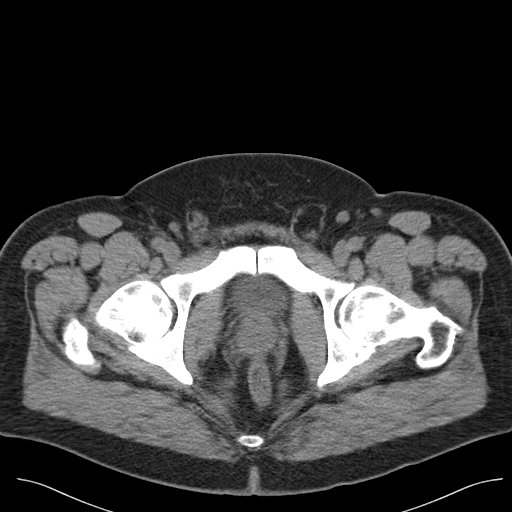
[im 23/100  soft-tissue]
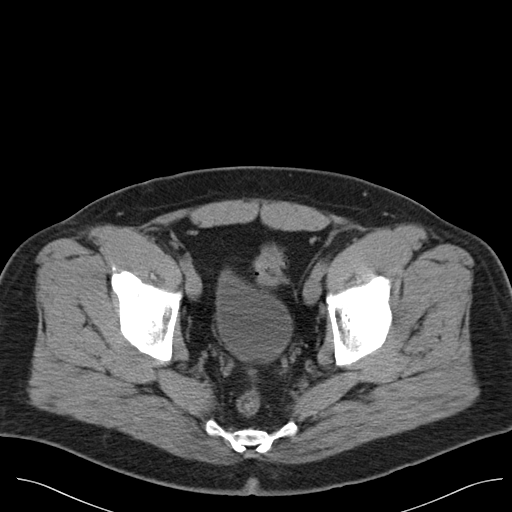
[im 31/100  soft-tissue]
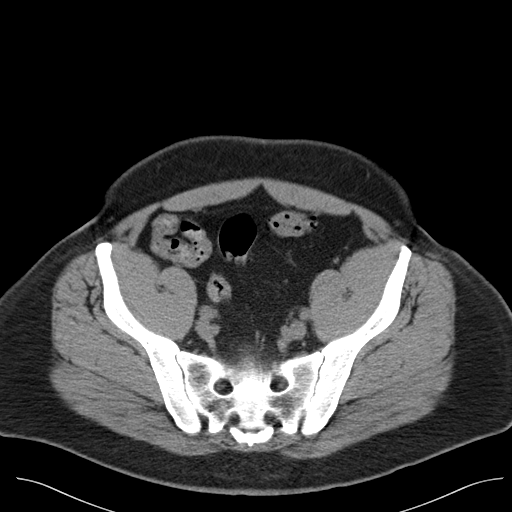
[im 39/100  soft-tissue]
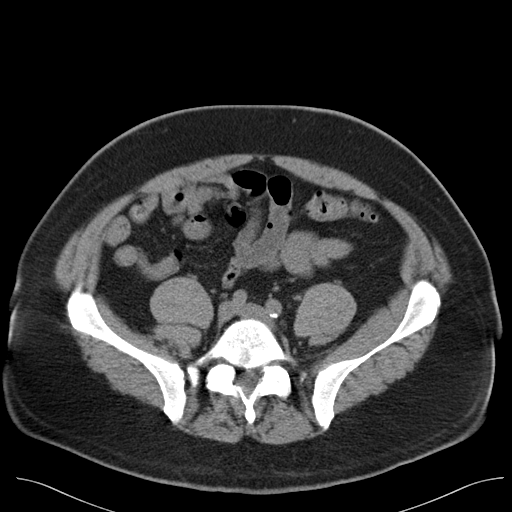
[im 46/100  soft-tissue]
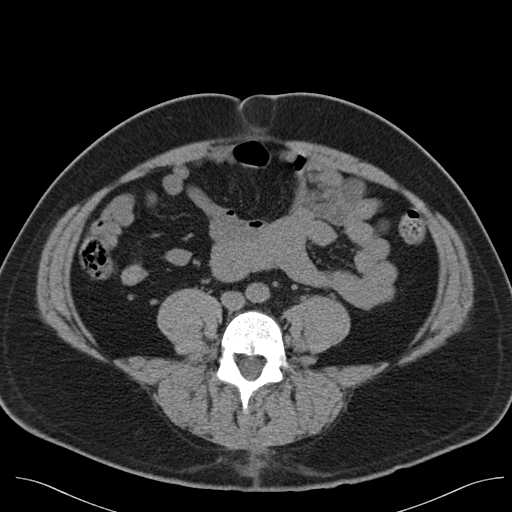
[im 54/100  soft-tissue]
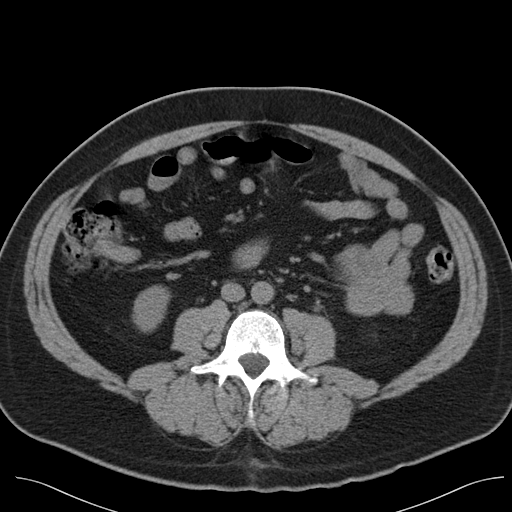
[im 61/100  soft-tissue]
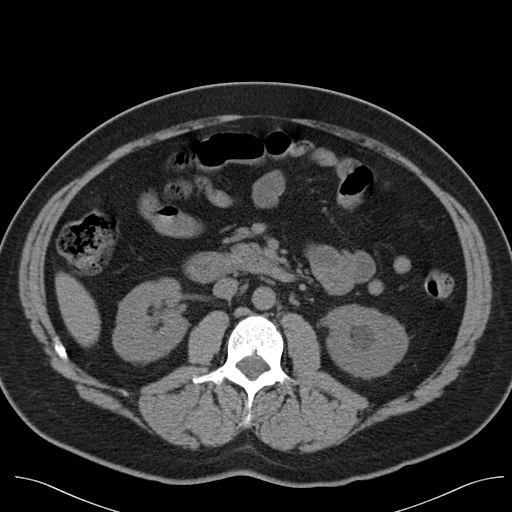
[im 69/100  soft-tissue]
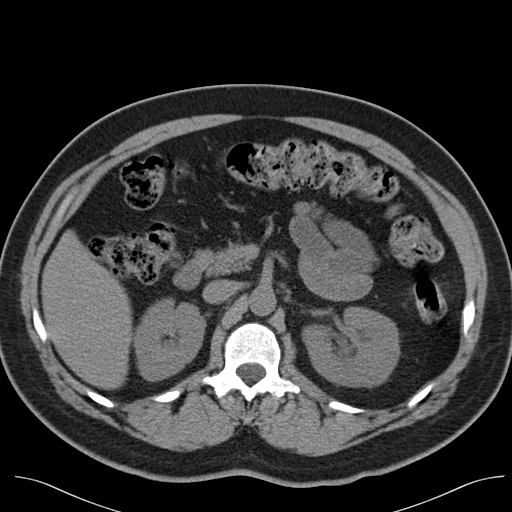
[im 69/100  bone]
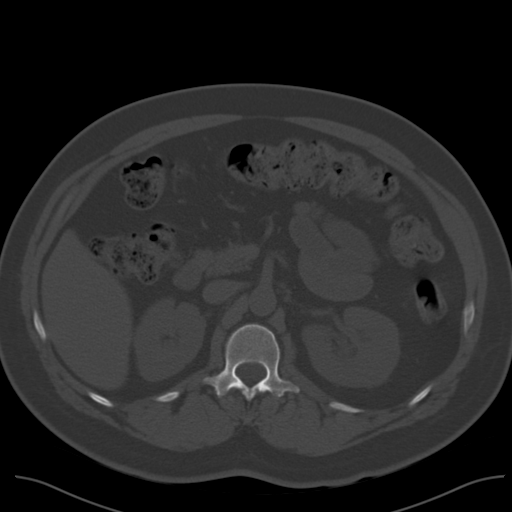
[im 77/100  soft-tissue]
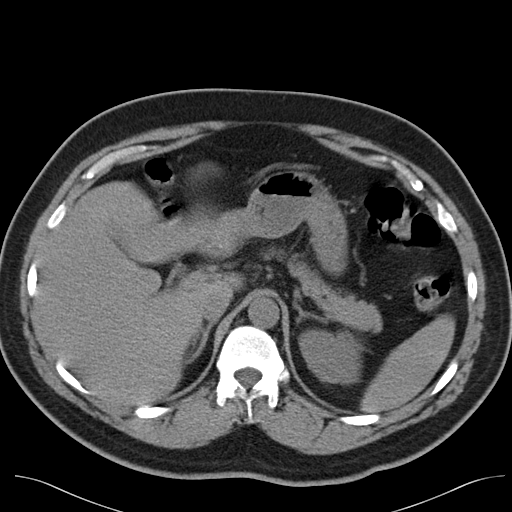
[im 84/100  soft-tissue]
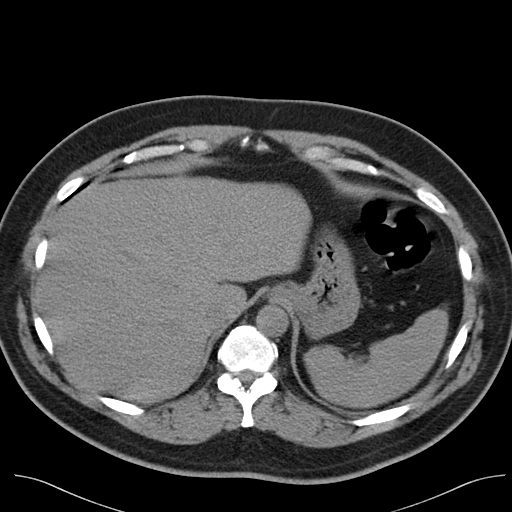
[im 92/100  soft-tissue]
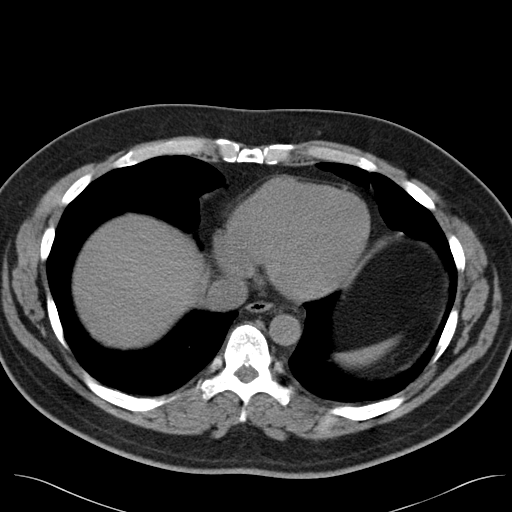

[Series 5: cor stone standard full · coronal · 0.80mm/px · 3 of 146 slices shown]
[im 49/146  soft-tissue]
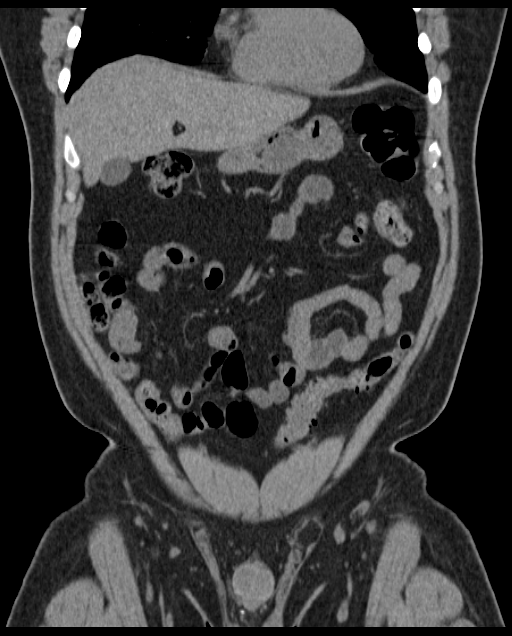
[im 65/146  soft-tissue]
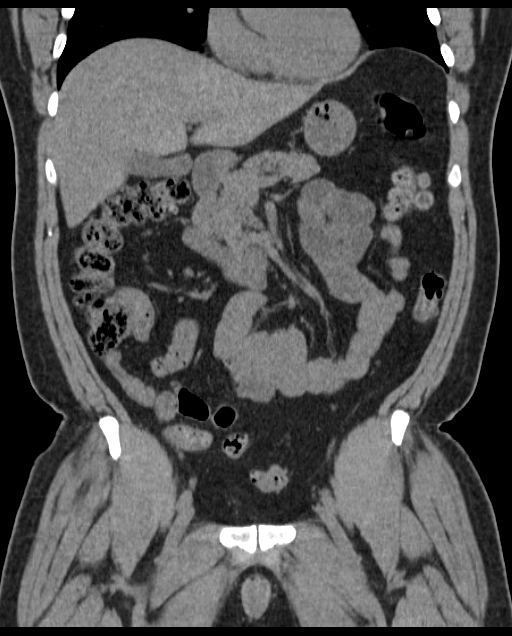
[im 81/146  soft-tissue]
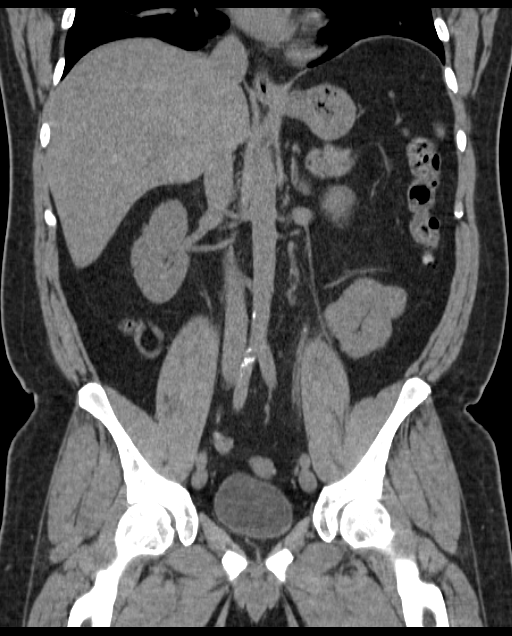

[15 of 46 positions shown; findings below may reference images not displayed]

FINDINGS: Approximate 4 mm calculus in the proximal left ureter causing severe
left hydronephrosis, mild left renal edema, and mild left
perinephric edema. No opaque urinary tract calculi elsewhere on
either side. Within the limits of the unenhanced technique, no focal
parenchymal abnormality involving either kidney.

Normal unenhanced appearance of the liver, spleen, pancreas, adrenal
glands, and gallbladder. No biliary ductal dilation. Focus of
accessory splenic tissue medial to the spleen just below the hilum.
Moderate aortoiliac atherosclerosis without aneurysm. No significant
lymphadenopathy.

Stomach relatively decompressed and unremarkable. Normal-appearing
small bowel. Descending and sigmoid colon diverticulosis without
evidence of acute diverticulitis. Remainder of the colon
unremarkable with moderate stool burden. Mobile cecum present in the
right upper quadrant. Normal appendix in the right mid abdomen.
Small left periumbilical hernia containing fat, unchanged. No
ascites.

Urinary bladder decompressed and unremarkable. Prostate gland and
seminal vesicles normal for age. Scarring in the right inguinal
region without recurrent hernia. Very small left inguinal hernia
containing fat, unchanged.

Bone window images demonstrate multilevel degenerative disc disease
involving the lumbar spine and mild lower thoracic spondylosis.
Scattered areas of hyperlucency throughout the visualized lung bases
with scarring in the right middle lobe and lingula. Heart size
normal with mild aortic annular calcification.
IMPRESSION: 1. Obstructing approximate 4 mm calculus in the proximal left
ureter.
2. No opaque urinary tract calculi elsewhere on either side.
3. Descending and sigmoid colon diverticulosis without evidence of
acute diverticulitis.
4. Moderate aortoiliac atherosclerosis which is quite advanced for
age.
5. Scattered areas of hyperlucency in the visualized lung bases
consistent with asthma and/or COPD.
6. Small left periumbilical hernia containing fat and small left
inguinal hernia containing fat, stable.
